# Patient Record
Sex: Male | Born: 1935 | Race: White | Hispanic: No | Marital: Married | Smoking: Former smoker
Health system: Southern US, Community
[De-identification: ages and names within clinical notes are randomized; demographics above are authoritative.]

## PROBLEM LIST (undated history)

## (undated) DIAGNOSIS — I10 Essential (primary) hypertension: Secondary | ICD-10-CM

## (undated) DIAGNOSIS — K219 Gastro-esophageal reflux disease without esophagitis: Secondary | ICD-10-CM

## (undated) DIAGNOSIS — J449 Chronic obstructive pulmonary disease, unspecified: Secondary | ICD-10-CM

## (undated) DIAGNOSIS — N400 Enlarged prostate without lower urinary tract symptoms: Secondary | ICD-10-CM

## (undated) DIAGNOSIS — N289 Disorder of kidney and ureter, unspecified: Secondary | ICD-10-CM

## (undated) DIAGNOSIS — G629 Polyneuropathy, unspecified: Secondary | ICD-10-CM

## (undated) DIAGNOSIS — D649 Anemia, unspecified: Secondary | ICD-10-CM

## (undated) HISTORY — PX: LUMBAR LAMINECTOMY: SHX95

## (undated) HISTORY — PX: HEMORRHOID SURGERY: SHX153

## (undated) HISTORY — PX: BACK SURGERY: SHX140

## (undated) HISTORY — PX: NECK SURGERY: SHX720

## (undated) HISTORY — PX: TONSILLECTOMY: SUR1361

## (undated) HISTORY — PX: APPENDECTOMY: SHX54

---

## 2003-01-07 ENCOUNTER — Emergency Department (HOSPITAL_COMMUNITY): Admission: EM | Admit: 2003-01-07 | Discharge: 2003-01-07 | Payer: Self-pay | Admitting: Emergency Medicine

## 2003-01-07 ENCOUNTER — Encounter: Payer: Self-pay | Admitting: Emergency Medicine

## 2003-01-10 ENCOUNTER — Inpatient Hospital Stay (HOSPITAL_COMMUNITY): Admission: EM | Admit: 2003-01-10 | Discharge: 2003-01-14 | Payer: Self-pay | Admitting: Emergency Medicine

## 2003-01-11 ENCOUNTER — Encounter: Payer: Self-pay | Admitting: Internal Medicine

## 2003-01-12 ENCOUNTER — Encounter: Payer: Self-pay | Admitting: Emergency Medicine

## 2003-03-03 ENCOUNTER — Encounter: Payer: Self-pay | Admitting: Family Medicine

## 2003-03-03 ENCOUNTER — Ambulatory Visit (HOSPITAL_COMMUNITY): Admission: RE | Admit: 2003-03-03 | Discharge: 2003-03-03 | Payer: Self-pay | Admitting: Family Medicine

## 2003-03-29 ENCOUNTER — Encounter: Payer: Self-pay | Admitting: Critical Care Medicine

## 2003-03-29 ENCOUNTER — Encounter (INDEPENDENT_AMBULATORY_CARE_PROVIDER_SITE_OTHER): Payer: Self-pay | Admitting: Specialist

## 2003-03-29 ENCOUNTER — Ambulatory Visit (HOSPITAL_COMMUNITY): Admission: RE | Admit: 2003-03-29 | Discharge: 2003-03-29 | Payer: Self-pay | Admitting: Critical Care Medicine

## 2005-03-11 ENCOUNTER — Encounter: Admission: RE | Admit: 2005-03-11 | Discharge: 2005-03-11 | Payer: Self-pay | Admitting: Nephrology

## 2005-03-25 ENCOUNTER — Encounter: Admission: RE | Admit: 2005-03-25 | Discharge: 2005-03-25 | Payer: Self-pay | Admitting: Nephrology

## 2005-04-14 ENCOUNTER — Encounter: Admission: RE | Admit: 2005-04-14 | Discharge: 2005-04-14 | Payer: Self-pay | Admitting: Family Medicine

## 2005-10-16 ENCOUNTER — Encounter: Admission: RE | Admit: 2005-10-16 | Discharge: 2005-10-16 | Payer: Self-pay | Admitting: Family Medicine

## 2006-07-31 ENCOUNTER — Ambulatory Visit (HOSPITAL_COMMUNITY): Admission: RE | Admit: 2006-07-31 | Discharge: 2006-07-31 | Payer: Self-pay | Admitting: Orthopedic Surgery

## 2006-08-01 ENCOUNTER — Inpatient Hospital Stay (HOSPITAL_COMMUNITY): Admission: EM | Admit: 2006-08-01 | Discharge: 2006-08-05 | Payer: Self-pay | Admitting: Emergency Medicine

## 2006-09-16 HISTORY — PX: JOINT REPLACEMENT: SHX530

## 2006-09-23 ENCOUNTER — Encounter: Admission: RE | Admit: 2006-09-23 | Discharge: 2006-09-23 | Payer: Self-pay | Admitting: Orthopedic Surgery

## 2006-12-01 ENCOUNTER — Inpatient Hospital Stay (HOSPITAL_COMMUNITY): Admission: RE | Admit: 2006-12-01 | Discharge: 2006-12-09 | Payer: Self-pay | Admitting: Orthopedic Surgery

## 2006-12-03 ENCOUNTER — Ambulatory Visit: Payer: Self-pay | Admitting: Physical Medicine & Rehabilitation

## 2006-12-05 ENCOUNTER — Encounter: Payer: Self-pay | Admitting: Vascular Surgery

## 2006-12-05 ENCOUNTER — Ambulatory Visit: Payer: Self-pay | Admitting: Vascular Surgery

## 2008-02-11 ENCOUNTER — Ambulatory Visit: Payer: Self-pay | Admitting: Critical Care Medicine

## 2008-02-11 DIAGNOSIS — J449 Chronic obstructive pulmonary disease, unspecified: Secondary | ICD-10-CM

## 2008-02-11 DIAGNOSIS — I10 Essential (primary) hypertension: Secondary | ICD-10-CM | POA: Insufficient documentation

## 2008-02-11 DIAGNOSIS — J438 Other emphysema: Secondary | ICD-10-CM

## 2008-02-11 DIAGNOSIS — J4489 Other specified chronic obstructive pulmonary disease: Secondary | ICD-10-CM | POA: Insufficient documentation

## 2008-02-11 DIAGNOSIS — E785 Hyperlipidemia, unspecified: Secondary | ICD-10-CM

## 2008-03-10 ENCOUNTER — Ambulatory Visit: Payer: Self-pay | Admitting: Emergency Medicine

## 2008-03-11 ENCOUNTER — Encounter: Payer: Self-pay | Admitting: Critical Care Medicine

## 2008-05-25 ENCOUNTER — Ambulatory Visit: Payer: Self-pay | Admitting: Emergency Medicine

## 2008-07-27 ENCOUNTER — Ambulatory Visit: Payer: Self-pay | Admitting: Emergency Medicine

## 2008-08-26 ENCOUNTER — Ambulatory Visit: Payer: Self-pay | Admitting: Emergency Medicine

## 2008-10-13 ENCOUNTER — Ambulatory Visit: Payer: Self-pay | Admitting: Emergency Medicine

## 2009-08-15 ENCOUNTER — Encounter: Admission: RE | Admit: 2009-08-15 | Discharge: 2009-08-15 | Payer: Self-pay | Admitting: Family Medicine

## 2009-10-21 ENCOUNTER — Inpatient Hospital Stay (HOSPITAL_COMMUNITY): Admission: EM | Admit: 2009-10-21 | Discharge: 2009-10-24 | Payer: Self-pay | Admitting: Emergency Medicine

## 2009-12-16 ENCOUNTER — Inpatient Hospital Stay (HOSPITAL_COMMUNITY): Admission: EM | Admit: 2009-12-16 | Discharge: 2009-12-18 | Payer: Self-pay | Admitting: Emergency Medicine

## 2009-12-19 ENCOUNTER — Encounter: Admission: RE | Admit: 2009-12-19 | Discharge: 2010-02-21 | Payer: Self-pay | Admitting: Family Medicine

## 2010-02-09 ENCOUNTER — Observation Stay (HOSPITAL_COMMUNITY): Admission: EM | Admit: 2010-02-09 | Discharge: 2010-02-11 | Payer: Self-pay | Admitting: Emergency Medicine

## 2010-04-30 ENCOUNTER — Encounter: Admission: RE | Admit: 2010-04-30 | Discharge: 2010-04-30 | Payer: Self-pay | Admitting: Family Medicine

## 2010-05-09 ENCOUNTER — Encounter
Admission: RE | Admit: 2010-05-09 | Discharge: 2010-05-09 | Payer: Self-pay | Admitting: Physical Medicine and Rehabilitation

## 2010-05-18 ENCOUNTER — Emergency Department (HOSPITAL_COMMUNITY): Admission: EM | Admit: 2010-05-18 | Discharge: 2010-05-19 | Payer: Self-pay | Admitting: Emergency Medicine

## 2010-10-07 ENCOUNTER — Encounter: Payer: Self-pay | Admitting: Nephrology

## 2010-10-24 ENCOUNTER — Emergency Department (HOSPITAL_COMMUNITY): Payer: MEDICARE

## 2010-10-24 ENCOUNTER — Inpatient Hospital Stay (HOSPITAL_COMMUNITY)
Admission: EM | Admit: 2010-10-24 | Discharge: 2010-10-26 | DRG: 192 | Disposition: A | Discharge status: Home / Self Care | Payer: MEDICARE | Attending: Internal Medicine | Admitting: Internal Medicine

## 2010-10-24 DIAGNOSIS — I129 Hypertensive chronic kidney disease with stage 1 through stage 4 chronic kidney disease, or unspecified chronic kidney disease: Secondary | ICD-10-CM | POA: Diagnosis present

## 2010-10-24 DIAGNOSIS — J449 Chronic obstructive pulmonary disease, unspecified: Principal | ICD-10-CM | POA: Diagnosis present

## 2010-10-24 DIAGNOSIS — F411 Generalized anxiety disorder: Secondary | ICD-10-CM | POA: Diagnosis present

## 2010-10-24 DIAGNOSIS — R4182 Altered mental status, unspecified: Secondary | ICD-10-CM | POA: Diagnosis present

## 2010-10-24 DIAGNOSIS — N183 Chronic kidney disease, stage 3 unspecified: Secondary | ICD-10-CM | POA: Diagnosis present

## 2010-10-24 DIAGNOSIS — Z8673 Personal history of transient ischemic attack (TIA), and cerebral infarction without residual deficits: Secondary | ICD-10-CM

## 2010-10-24 DIAGNOSIS — J4489 Other specified chronic obstructive pulmonary disease: Principal | ICD-10-CM | POA: Diagnosis present

## 2010-10-24 DIAGNOSIS — E785 Hyperlipidemia, unspecified: Secondary | ICD-10-CM | POA: Diagnosis present

## 2010-10-24 DIAGNOSIS — R269 Unspecified abnormalities of gait and mobility: Secondary | ICD-10-CM | POA: Diagnosis present

## 2010-10-24 LAB — CBC
HCT: 40.3 % (ref 39.0–52.0)
Hemoglobin: 13.8 g/dL (ref 13.0–17.0)
MCH: 31.3 pg (ref 26.0–34.0)
MCHC: 34.2 g/dL (ref 30.0–36.0)
MCV: 91.4 fL (ref 78.0–100.0)

## 2010-10-24 LAB — DIFFERENTIAL
Basophils Absolute: 0 10*3/uL (ref 0.0–0.1)
Lymphocytes Relative: 4 % — ABNORMAL LOW (ref 12–46)
Lymphs Abs: 0.5 10*3/uL — ABNORMAL LOW (ref 0.7–4.0)
Monocytes Absolute: 1.4 10*3/uL — ABNORMAL HIGH (ref 0.1–1.0)
Monocytes Relative: 10 % (ref 3–12)
Neutro Abs: 11.4 10*3/uL — ABNORMAL HIGH (ref 1.7–7.7)

## 2010-10-24 LAB — COMPREHENSIVE METABOLIC PANEL
ALT: 13 U/L (ref 0–53)
CO2: 25 mEq/L (ref 19–32)
Calcium: 8.8 mg/dL (ref 8.4–10.5)
GFR calc non Af Amer: 25 mL/min — ABNORMAL LOW (ref >60)
Glucose, Bld: 90 mg/dL (ref 70–99)
Sodium: 137 mEq/L (ref 135–145)

## 2010-10-25 ENCOUNTER — Inpatient Hospital Stay (HOSPITAL_COMMUNITY): Payer: MEDICARE

## 2010-10-25 ENCOUNTER — Emergency Department (HOSPITAL_COMMUNITY): Payer: MEDICARE

## 2010-10-25 DIAGNOSIS — G459 Transient cerebral ischemic attack, unspecified: Secondary | ICD-10-CM

## 2010-10-25 LAB — PROTIME-INR
INR: 1.03 (ref 0.00–1.49)
Prothrombin Time: 13.7 seconds (ref 11.6–15.2)

## 2010-10-25 LAB — URINALYSIS, ROUTINE W REFLEX MICROSCOPIC
Hgb urine dipstick: NEGATIVE
Ketones, ur: NEGATIVE mg/dL
Leukocytes, UA: NEGATIVE
Protein, ur: 100 mg/dL — AB
Urine Glucose, Fasting: NEGATIVE mg/dL

## 2010-10-25 LAB — COMPREHENSIVE METABOLIC PANEL
Alkaline Phosphatase: 52 U/L (ref 39–117)
BUN: 35 mg/dL — ABNORMAL HIGH (ref 6–23)
Calcium: 8.3 mg/dL — ABNORMAL LOW (ref 8.4–10.5)
GFR calc non Af Amer: 26 mL/min — ABNORMAL LOW (ref >60)
Glucose, Bld: 162 mg/dL — ABNORMAL HIGH (ref 70–99)
Total Protein: 6.2 g/dL (ref 6.0–8.3)

## 2010-10-25 LAB — CARDIAC PANEL(CRET KIN+CKTOT+MB+TROPI)
CK, MB: 3.9 ng/mL (ref 0.3–4.0)
CK, MB: 3.5 ng/mL (ref 0.3–4.0)
CK, MB: 3.9 ng/mL (ref 0.3–4.0)
Relative Index: 1.9 (ref 0.0–2.5)
Relative Index: 2 (ref 0.0–2.5)
Total CK: 183 U/L (ref 7–232)
Total CK: 186 U/L (ref 7–232)
Troponin I: 0.05 ng/mL (ref 0.00–0.06)
Troponin I: 0.04 ng/mL (ref 0.00–0.06)

## 2010-10-25 LAB — NA AND K (SODIUM & POTASSIUM), RAND UR
Potassium Urine: 36 mEq/L
Sodium, Ur: 44 mEq/L

## 2010-10-25 LAB — CBC
Hemoglobin: 13.1 g/dL (ref 13.0–17.0)
RBC: 4.19 MIL/uL — ABNORMAL LOW (ref 4.22–5.81)
WBC: 9.8 10*3/uL (ref 4.0–10.5)

## 2010-10-25 LAB — APTT: aPTT: 34 seconds (ref 24–37)

## 2010-10-25 LAB — LIPID PANEL
Cholesterol: 188 mg/dL (ref 0–200)
Total CHOL/HDL Ratio: 3.4 RATIO

## 2010-10-25 LAB — TROPONIN I: Troponin I: 0.06 ng/mL (ref 0.00–0.06)

## 2010-10-25 LAB — PROTEIN, URINE, RANDOM: Total Protein, Urine: 49 mg/dL

## 2010-10-25 LAB — CK TOTAL AND CKMB (NOT AT ARMC): Total CK: 175 U/L (ref 7–232)

## 2010-10-26 LAB — BASIC METABOLIC PANEL
CO2: 22 mEq/L (ref 19–32)
Calcium: 8.4 mg/dL (ref 8.4–10.5)
Chloride: 109 mEq/L (ref 96–112)
Creatinine, Ser: 2.1 mg/dL — ABNORMAL HIGH (ref 0.4–1.5)
GFR calc Af Amer: 37 mL/min — ABNORMAL LOW (ref >60)
Glucose, Bld: 111 mg/dL — ABNORMAL HIGH (ref 70–99)

## 2010-10-28 NOTE — H&P (Signed)
NAMEALDWIN, MICALIZZI               ACCOUNT NO.:  [id number]  MEDICAL RECORD NO.:  [id number]           PATIENT TYPE:  E  LOCATION:  WLED                         FACILITY:  [facility]  PHYSICIAN:  Conley Canal, MD      DATE OF BIRTH:  [birth year]  DATE OF ADMISSION:  10/24/2010 DATE OF DISCHARGE:                             HISTORY & PHYSICAL   PRIMARY CARE PHYSICIAN:  Dr. Lanora Manis, but he is currently looking for a new PCP.  NEPHROLOGIST:  Dr. Darrick Penna.  CHIEF COMPLAINT:  Cough and confusion.  HISTORY OF PRESENT ILLNESS:  This is a 77 year old male, very pleasant, who has a history of hypertension, chronic kidney disease, baseline creatinine around 1.7, recurrent for COPD, anxiety disorder, hyperlipidemia, came in with complaints of cough, ongoing for 2 days or so as well as reported history of some acute confusion and losing balance.  The patient says that he does not recall what happened, but at the time of evaluation he is quite lucid and answering questions appropriately.  He denies any fever.  The cough is productive of yellowish colored sputum.  Nobody else sick at home.  He denies any headache.  He noted that his balance was off today.  Denies any fall. No nausea, vomiting, or diarrhea.  No genitourinary symptoms.  PAST MEDICAL HISTORY: 1. Chronic kidney disease, stage 3. 2. Hypertension. 3. Hyperlipidemia. 4. Anxiety disorder. 5. COPD. 6. Recurrent falls.  ALLERGIES:  No known drug allergies.  HOME MEDICATIONS:  Lisinopril, belladonna, Neurontin, Synthroid, trazodone, Cymbalta, ranitidine, Proventil, Combivent, Symbicort, aspirin.  SOCIAL HISTORY:  Patient is married, lives with his wife.  Stopped smoking cigarettes many years ago.  Denies illicit drugs.  Drinks alcohol occasionally.  REVIEW OF SYSTEMS:  Unremarkable except as highlighted in the history of present illness.  PHYSICAL EXAMINATION:  GENERAL:  This is a well looking male, not in acute  distress. VITAL SIGNS:  Blood pressure 122/56, heart rate is 93, temperature 98.2, respirations 15, oxygen saturation is 95% on 2 L nasal cannula. HEAD, EARS, EYES, NOSE, AND THROAT:  Pupils equal, reacting to light. No jugular venous distention.  No carotid bruits. RESPIRATORY:  Good air entry bilaterally.  Some basilar rhonchi.  No wheezing. CARDIOVASCULAR:  S1 and S2 heart sounds are heard.  No murmurs.  Pulse regular. ABDOMEN:  Soft, nontender.  No palpable organomegaly.  Bowel sounds are normal. CNS:  The patient is alert and oriented in person, place, and time.  He does not have any focal neurological deficits. EXTREMITIES:  There is no pedal edema.  Peripheral pulses are equal.  LABORATORY DATA:  Labs were reviewed, significant for WBC 13.6, hemoglobin 13.8, hematocrit 40.3, platelet count 275, neutrophils 84%. Sodium 137, potassium 4.6, BUN 36, creatinine 2.49.  LFTs normal. Urinalysis negative.  Chest x-ray shows emphysema, chronic opacity in the lingula probably from atelectasis or scarring and chronic left apical scarring.  EKG shows normal sinus rhythm, right bundle-branch block.  IMPRESSION:  A 77 year old male who is presenting with acute delirium and loss of balance.  He has suggestion of early pneumonia versus chronic obstructive pulmonary disease exacerbation as well as  possibility of small cerebrovascular accident versus transient ischemic attack.  He also has some worsening renal insufficiency, which could be multifactorial in etiology including dehydration and medications, ongoing infection.  PLAN: 1. Transient ischemic attack versus cerebrovascular accident.  Patient     will be admitted to Telemetry.  We will obtain serial cardiac     enzymes, stroke protocol including MRI, 2-D echocardiogram, carotid     duplex.  Meanwhile, he will be on aspirin and statin.  We will     consult Physical Therapy. 2. Community-acquired pneumonia versus chronic obstructive  pulmonary     disease.  We will place on ceftriaxone and azithromycin to complete     7 days of antibiotics. 3. Acute-on-chronic kidney disease.  We will gently rehydrate the     patient, hold lisinopril for now. 4. Hypertension.  Seems controlled.  Continue home medications. 5. Peripheral neuropathy, hypothyroidism, seems stable.  Continue home     medications. 6. Deep vein thrombosis prophylaxis, Lovenox. 7. Gastrointestinal prophylaxis, PPI. 8. Depression.  Seems stable.  Continue Cymbalta and trazodone. 9. Patient's condition is guarded.     Conley Canal, MD     SR/MEDQ  D:  10/25/2010  T:  10/25/2010  Job:  [id number]  cc:   Dr. Darrick Penna  Electronically Signed by Conley Canal  on 10/25/2010 08:31:11 PM

## 2010-11-10 NOTE — Discharge Summary (Signed)
Walter [name], Walter [name]               ACCOUNT NO.:  [id number]  MEDICAL RECORD NO.:  [id number]           PATIENT TYPE:  I  LOCATION:  1419                         FACILITY:  [facility]  PHYSICIAN:  Walter Scott, MD     DATE OF BIRTH:  20-Aug-1930  DATE OF ADMISSION:  10/24/2010 DATE OF DISCHARGE:  10/26/2010                              DISCHARGE SUMMARY   PRIMARY CARE PHYSICIAN:  Walter [name], M.D.  NEPHROLOGIST:  Walter [name], M.D.  DISCHARGE DIAGNOSES: 1. Altered mental status, resolved. 2. Mild acute-on-chronic bronchitis, improved. 3. Chronic kidney disease, creatinine possibly at baseline. 4. Hypertension. 5. Hyperlipidemia. 6. History of anxiety disorder. 7. History of recurrent falls and continues to have unsteady gait, but     declines a skilled nursing facility placement. 8. H/o CVA's  9. Hypothyroid 10. Moderaete LVH on echo.  DISCHARGE MEDICATIONS: 1. Azithromycin 500 mg p.o. on October 27, 2010, and then     discontinue. 2. Enteric-coated aspirin 325 mg p.o. daily. 3. Simvastatin 20 mg p.o. q.h.s. 4. Alprazolam 0.5 mg p.o. q.h.s. p.r.n. for insomnia. 5. Albuterol inhaler 90 mcg, two puffs inhaled q.4 hours p.r.n. 6. Asmanex inhaler 220 mcg, two puffs inhaled daily. 7. Cymbalta 30 mg p.o. daily. 8. Gabapentin 100 mg p.o. b.i.d. 9. Levothyroxine 25 mcg p.o. daily. 10.Lisinopril 40 mg p.o. daily. 11.PreserVision one tablet p.o. b.i.d. 12.Ranitidine 300 mg p.o. q.h.s. 13.Symbicort 160/4.5 mcg, one puff inhaled b.i.d. 14.Spiriva 18 mcg inhaled daily. 15.Trazodone 50 mg p.o. q.h.s. 16.Vitamin D3 1000 units, two tablets p.o. daily.  IMAGING STUDIES AND PROCEDURES: 1. MRI of the head without contrast.  Impression:  Motion-degraded     exam.  No acute infarct.  Remote infarcts and prominent small     vessel disease-type changes.  Global atrophy with stable mild     ventricular prominence. 2. MRA of the head.  Impression:  Intracranial atherosclerotic-type   changes detected on this motion-degraded exam. 3. Chest x-ray on February 8.  Impression:     a.     Emphysema.     b.     Chronic opacity in the lingula, probably from atelectasis or      scarring.     c.     Chronic left apical scarring. 4. CT of the head without contrast.  Impression:     a.     Stable remote lacunar infarcts without acute intracranial      findings.     b.     Chronic maxillary sinusitis with small right mastoid      effusion. 5. A 2-D echocardiogram shows moderate left ventricular hypertrophy.     Systolic function was normal.  Estimated ejection fraction was 55%     to 60%.  LABORATORY DATA: 1. Basic metabolic panel today significant for BUN of 41, creatinine     2.10. 2. Cardiac enzymes were cycled and negative.  Hemoglobin A1c was 6.2.     Lipid panel with LDL of 124.  Hepatic panel only significant for     albumin of 3.2.  INR is 1.03.  CBC is within normal limit. 3. Urinalysis negative  for features of urinary tract infection.  CONSULTATIONS:  None.  DIET:  Heart-healthy diet.  ACTIVITY:  Increase activity slowly and with assistance.  The patient will have 24/7 supervision from his spouse.  He is to use his home durable medical equipment including his rolling walker, two-in-one, cane.  COMPLAINTS:  Today none.  The patient indicates that he has no further cough or dyspnea or chest pain.  PHYSICAL EXAMINATION:  GENERAL:  Walter [name] is in no obvious distress. VITAL SIGNS:  Telemetry shows sinus rhythm in 60s to 70s with few PVCs, not convincing for three beat nonsustained ventricular tachycardia. Temperature 98.1 degrees Fahrenheit, pulse 68 per minute, respiration 18 per minute, blood pressure 117/57 mmHg and saturating at 94% on room air. RESPIRATORY SYSTEM:  Clear.  No increased work of breathing. CARDIOVASCULAR SYSTEM:  First and second heart sounds heard.  Regular rate and rhythm. ABDOMEN:  Soft and bowel sounds present. CENTRAL NERVOUS  SYSTEM:  Patient is awake, alert, oriented x4 with no focal neurological deficits.  HOSPITAL COURSE:  Walter [name] is a 77 year old Caucasian male patient with history of hypertension, stage 3 chronic kidney disease, COPD, anxiety disorder and possible depression and hyperlipidemia who presented with cough of 2 days' duration and reported history of acute confusion and losing balance, although by the time the patient was evaluated in the ED he was quite lucid and appropriate.  He was admitted for further evaluation and management.  1. Altered mental status.  Unclear etiology.  However, the patient was     admitted.  His CT scan shows old CVAs.  This was followed with an     MRI which did not show any acute infarcts.  His altered mental status may have been     secondary to mild acute bronchitis.  His mental status, however,     quickly resolved.  He is eager to go home. 2. Mild acute bronchitis complicating COPD.  He was placed on IV     antibiotics and nebulizers.  He will complete a total 3-day course     of Zithromax.  He has no further cough and has been afebrile and     his chest is clear.  He can continue his other home COPDmedications. 3. Mild acute-on-chronic kidney disease versus chronic kidney disease     with a new high baseline creatinine.  The patient will follow up     with his primary nephrologist as an outpatient.  He is to continue     his home medications. 4. Old CVAs on CT scan.  The patient will be on aspirin and statins.     Physical Therapy and Occupational Therapy evaluated him and     indicated that his gait was unsteady and recommended short-term     skilled nursing facility, which the patient has clearly declined     and indicates that he will be returning home and his wife is with     him 24/7 and she will be able to assist him.  DISPOSITION:  The patient is discharged home in stable condition.  FOLLOWUP: 1. The patient has a preset appointment to see his  primary care     physician, Dr. Larita [name] on February 15.  The patient is advised     to keep up that appointment. 2. Dr. Darrick Penna.  The patient is to call for appointment and his     renal panel can be periodically followed up.  Time taken in coordinating this discharge was  45 minutes.     Walter Scott, MD     AH/MEDQ  D:  10/26/2010  T:  10/26/2010  Job:  [id number]  cc:   Walter [name], M.D.  James L. [name], M.D. Fax: [phone]  Electronically Signed by Walter Scott MD on 11/10/2010 06:18:35 PM

## 2010-11-29 LAB — POCT I-STAT, CHEM 8
BUN: 34 mg/dL — ABNORMAL HIGH (ref 6–23)
Calcium, Ion: 1 mmol/L — ABNORMAL LOW (ref 1.12–1.32)
Creatinine, Ser: 1.6 mg/dL — ABNORMAL HIGH (ref 0.4–1.5)
Glucose, Bld: 101 mg/dL — ABNORMAL HIGH (ref 70–99)
TCO2: 19 mmol/L (ref 0–100)

## 2010-11-29 LAB — CBC
MCH: 32.5 pg (ref 26.0–34.0)
MCHC: 34.3 g/dL (ref 30.0–36.0)
Platelets: 361 10*3/uL (ref 150–400)
RBC: 4.47 MIL/uL (ref 4.22–5.81)

## 2010-11-29 LAB — URINALYSIS, ROUTINE W REFLEX MICROSCOPIC
Bilirubin Urine: NEGATIVE
Glucose, UA: NEGATIVE mg/dL
Protein, ur: 100 mg/dL — AB
Urobilinogen, UA: 0.2 mg/dL (ref 0.0–1.0)

## 2010-11-29 LAB — COMPREHENSIVE METABOLIC PANEL
ALT: 16 U/L (ref 0–53)
AST: 17 U/L (ref 0–37)
Albumin: 3.9 g/dL (ref 3.5–5.2)
Calcium: 9.2 mg/dL (ref 8.4–10.5)
Creatinine, Ser: 1.65 mg/dL — ABNORMAL HIGH (ref 0.4–1.5)
GFR calc Af Amer: 49 mL/min — ABNORMAL LOW (ref >60)
Sodium: 140 mEq/L (ref 135–145)

## 2010-11-29 LAB — DIFFERENTIAL
Eosinophils Relative: 1 % (ref 0–5)
Lymphocytes Relative: 10 % — ABNORMAL LOW (ref 12–46)
Lymphs Abs: 1.6 10*3/uL (ref 0.7–4.0)
Monocytes Absolute: 1.4 10*3/uL — ABNORMAL HIGH (ref 0.1–1.0)
Monocytes Relative: 8 % (ref 3–12)

## 2010-11-29 LAB — POCT CARDIAC MARKERS
CKMB, poc: <1 ng/mL — ABNORMAL LOW (ref 1.0–8.0)
Myoglobin, poc: 101 ng/mL (ref 12–200)
Myoglobin, poc: 89.2 ng/mL (ref 12–200)

## 2010-11-29 LAB — BRAIN NATRIURETIC PEPTIDE: Pro B Natriuretic peptide (BNP): <30 pg/mL (ref 0.0–100.0)

## 2010-11-29 LAB — URINE MICROSCOPIC-ADD ON

## 2010-12-03 LAB — BASIC METABOLIC PANEL
CO2: 20 mEq/L (ref 19–32)
CO2: 23 mEq/L (ref 19–32)
Calcium: 8.7 mg/dL (ref 8.4–10.5)
Chloride: 109 mEq/L (ref 96–112)
Chloride: 110 mEq/L (ref 96–112)
Chloride: 113 mEq/L — ABNORMAL HIGH (ref 96–112)
Creatinine, Ser: 1.79 mg/dL — ABNORMAL HIGH (ref 0.4–1.5)
Creatinine, Ser: 1.99 mg/dL — ABNORMAL HIGH (ref 0.4–1.5)
GFR calc Af Amer: 40 mL/min — ABNORMAL LOW (ref >60)
GFR calc Af Amer: 45 mL/min — ABNORMAL LOW (ref >60)
GFR calc non Af Amer: 37 mL/min — ABNORMAL LOW (ref >60)
Glucose, Bld: 102 mg/dL — ABNORMAL HIGH (ref 70–99)
Potassium: 5.4 mEq/L — ABNORMAL HIGH (ref 3.5–5.1)
Potassium: 5.4 mEq/L — ABNORMAL HIGH (ref 3.5–5.1)
Sodium: 139 mEq/L (ref 135–145)
Sodium: 139 mEq/L (ref 135–145)
Sodium: 140 mEq/L (ref 135–145)

## 2010-12-03 LAB — URINALYSIS, ROUTINE W REFLEX MICROSCOPIC
Bilirubin Urine: NEGATIVE
Glucose, UA: NEGATIVE mg/dL
Hgb urine dipstick: NEGATIVE
Ketones, ur: NEGATIVE mg/dL
pH: 5 (ref 5.0–8.0)

## 2010-12-03 LAB — COMPREHENSIVE METABOLIC PANEL
AST: 17 U/L (ref 0–37)
Albumin: 3.4 g/dL — ABNORMAL LOW (ref 3.5–5.2)
Calcium: 8.7 mg/dL (ref 8.4–10.5)
Chloride: 109 mEq/L (ref 96–112)
Creatinine, Ser: 1.78 mg/dL — ABNORMAL HIGH (ref 0.4–1.5)
GFR calc Af Amer: 45 mL/min — ABNORMAL LOW (ref >60)
Total Bilirubin: 0.6 mg/dL (ref 0.3–1.2)

## 2010-12-03 LAB — DIFFERENTIAL
Eosinophils Relative: 1 % (ref 0–5)
Lymphocytes Relative: 9 % — ABNORMAL LOW (ref 12–46)
Lymphs Abs: 1.1 10*3/uL (ref 0.7–4.0)
Monocytes Absolute: 1.2 10*3/uL — ABNORMAL HIGH (ref 0.1–1.0)

## 2010-12-03 LAB — CBC
MCV: 96.7 fL (ref 78.0–100.0)
MCV: 96.7 fL (ref 78.0–100.0)
Platelets: 322 10*3/uL (ref 150–400)
RBC: 4.22 MIL/uL (ref 4.22–5.81)
WBC: 10.7 10*3/uL — ABNORMAL HIGH (ref 4.0–10.5)
WBC: 12.9 10*3/uL — ABNORMAL HIGH (ref 4.0–10.5)

## 2010-12-03 LAB — CARDIAC PANEL(CRET KIN+CKTOT+MB+TROPI)
CK, MB: 2.2 ng/mL (ref 0.3–4.0)
Relative Index: 4 — ABNORMAL HIGH (ref 0.0–2.5)
Relative Index: INVALID (ref 0.0–2.5)
Total CK: 48 U/L (ref 7–232)
Troponin I: 0.03 ng/mL (ref 0.00–0.06)

## 2010-12-03 LAB — LACTIC ACID, PLASMA: Lactic Acid, Venous: 1.1 mmol/L (ref 0.5–2.2)

## 2010-12-03 LAB — URINE MICROSCOPIC-ADD ON

## 2010-12-05 LAB — DIFFERENTIAL
Basophils Absolute: 0 10*3/uL (ref 0.0–0.1)
Lymphocytes Relative: 7 % — ABNORMAL LOW (ref 12–46)
Monocytes Absolute: 1.2 10*3/uL — ABNORMAL HIGH (ref 0.1–1.0)
Neutro Abs: 11.8 10*3/uL — ABNORMAL HIGH (ref 1.7–7.7)
Neutrophils Relative %: 84 % — ABNORMAL HIGH (ref 43–77)

## 2010-12-05 LAB — URINE MICROSCOPIC-ADD ON

## 2010-12-05 LAB — POCT CARDIAC MARKERS
CKMB, poc: 2.9 ng/mL (ref 1.0–8.0)
Myoglobin, poc: 165 ng/mL (ref 12–200)
Myoglobin, poc: 201 ng/mL (ref 12–200)
Troponin i, poc: <0.05 ng/mL (ref 0.00–0.09)

## 2010-12-05 LAB — BASIC METABOLIC PANEL
BUN: 25 mg/dL — ABNORMAL HIGH (ref 6–23)
BUN: 37 mg/dL — ABNORMAL HIGH (ref 6–23)
CO2: 21 mEq/L (ref 19–32)
Calcium: 8.3 mg/dL — ABNORMAL LOW (ref 8.4–10.5)
Calcium: 8.4 mg/dL (ref 8.4–10.5)
Creatinine, Ser: 1.67 mg/dL — ABNORMAL HIGH (ref 0.4–1.5)
Creatinine, Ser: 1.81 mg/dL — ABNORMAL HIGH (ref 0.4–1.5)
GFR calc Af Amer: 44 mL/min — ABNORMAL LOW (ref >60)
GFR calc Af Amer: 49 mL/min — ABNORMAL LOW (ref >60)
Glucose, Bld: 94 mg/dL (ref 70–99)

## 2010-12-05 LAB — CBC
HCT: 40.4 % (ref 39.0–52.0)
MCHC: 33.1 g/dL (ref 30.0–36.0)
MCHC: 33.3 g/dL (ref 30.0–36.0)
MCV: 94.8 fL (ref 78.0–100.0)
MCV: 95.8 fL (ref 78.0–100.0)
Platelets: 228 10*3/uL (ref 150–400)
Platelets: 237 10*3/uL (ref 150–400)
Platelets: 260 10*3/uL (ref 150–400)
RBC: 3.76 MIL/uL — ABNORMAL LOW (ref 4.22–5.81)
RDW: 14.2 % (ref 11.5–15.5)
RDW: 14.7 % (ref 11.5–15.5)
WBC: 8.8 10*3/uL (ref 4.0–10.5)

## 2010-12-05 LAB — COMPREHENSIVE METABOLIC PANEL
Albumin: 3.9 g/dL (ref 3.5–5.2)
BUN: 54 mg/dL — ABNORMAL HIGH (ref 6–23)
Chloride: 107 mEq/L (ref 96–112)
Creatinine, Ser: 2.26 mg/dL — ABNORMAL HIGH (ref 0.4–1.5)
Total Bilirubin: 0.6 mg/dL (ref 0.3–1.2)
Total Protein: 6.9 g/dL (ref 6.0–8.3)

## 2010-12-05 LAB — URINE CULTURE

## 2010-12-05 LAB — CULTURE, BLOOD (ROUTINE X 2)
Culture: NO GROWTH
Culture: NO GROWTH

## 2010-12-05 LAB — VITAMIN B12: Vitamin B-12: 373 pg/mL (ref 211–911)

## 2010-12-05 LAB — URINALYSIS, ROUTINE W REFLEX MICROSCOPIC
Glucose, UA: NEGATIVE mg/dL
Hgb urine dipstick: NEGATIVE
Specific Gravity, Urine: 1.011 (ref 1.005–1.030)

## 2010-12-06 LAB — DIFFERENTIAL
Eosinophils Absolute: 0.1 10*3/uL (ref 0.0–0.7)
Lymphs Abs: 0.9 10*3/uL (ref 0.7–4.0)
Monocytes Relative: 7 % (ref 3–12)
Neutro Abs: 11 10*3/uL — ABNORMAL HIGH (ref 1.7–7.7)
Neutrophils Relative %: 84 % — ABNORMAL HIGH (ref 43–77)

## 2010-12-06 LAB — URINALYSIS, ROUTINE W REFLEX MICROSCOPIC
Glucose, UA: NEGATIVE mg/dL
Ketones, ur: NEGATIVE mg/dL
Leukocytes, UA: NEGATIVE
Nitrite: NEGATIVE
Nitrite: NEGATIVE
Protein, ur: 100 mg/dL — AB
Specific Gravity, Urine: 1.019 (ref 1.005–1.030)
Urobilinogen, UA: 1 mg/dL (ref 0.0–1.0)
pH: 5 (ref 5.0–8.0)

## 2010-12-06 LAB — BASIC METABOLIC PANEL
BUN: 36 mg/dL — ABNORMAL HIGH (ref 6–23)
BUN: 46 mg/dL — ABNORMAL HIGH (ref 6–23)
BUN: 48 mg/dL — ABNORMAL HIGH (ref 6–23)
CO2: 19 mEq/L (ref 19–32)
CO2: 23 mEq/L (ref 19–32)
Calcium: 7.8 mg/dL — ABNORMAL LOW (ref 8.4–10.5)
Calcium: 8.6 mg/dL (ref 8.4–10.5)
Chloride: 102 mEq/L (ref 96–112)
Chloride: 110 mEq/L (ref 96–112)
Creatinine, Ser: 1.82 mg/dL — ABNORMAL HIGH (ref 0.4–1.5)
Creatinine, Ser: 2.69 mg/dL — ABNORMAL HIGH (ref 0.4–1.5)
GFR calc Af Amer: 28 mL/min — ABNORMAL LOW (ref >60)
GFR calc Af Amer: 29 mL/min — ABNORMAL LOW (ref >60)
GFR calc Af Amer: 37 mL/min — ABNORMAL LOW (ref >60)
GFR calc Af Amer: 44 mL/min — ABNORMAL LOW (ref >60)
GFR calc non Af Amer: 23 mL/min — ABNORMAL LOW (ref >60)
GFR calc non Af Amer: 31 mL/min — ABNORMAL LOW (ref >60)
Glucose, Bld: 99 mg/dL (ref 70–99)
Potassium: 5.5 mEq/L — ABNORMAL HIGH (ref 3.5–5.1)
Potassium: 6 mEq/L — ABNORMAL HIGH (ref 3.5–5.1)
Sodium: 139 mEq/L (ref 135–145)
Sodium: 140 mEq/L (ref 135–145)

## 2010-12-06 LAB — HEPATIC FUNCTION PANEL
ALT: 21 U/L (ref 0–53)
AST: 35 U/L (ref 0–37)
Bilirubin, Direct: 0.1 mg/dL (ref 0.0–0.3)
Indirect Bilirubin: 0.2 mg/dL — ABNORMAL LOW (ref 0.3–0.9)
Total Bilirubin: 0.3 mg/dL (ref 0.3–1.2)

## 2010-12-06 LAB — PROTIME-INR
INR: 1.09 (ref 0.00–1.49)
Prothrombin Time: 14 seconds (ref 11.6–15.2)

## 2010-12-06 LAB — CBC
HCT: 35.8 % — ABNORMAL LOW (ref 39.0–52.0)
MCHC: 34.2 g/dL (ref 30.0–36.0)
Platelets: 327 10*3/uL (ref 150–400)
Platelets: 353 10*3/uL (ref 150–400)
Platelets: 388 10*3/uL (ref 150–400)
RBC: 4.39 MIL/uL (ref 4.22–5.81)
RDW: 13.6 % (ref 11.5–15.5)
WBC: 13.1 10*3/uL — ABNORMAL HIGH (ref 4.0–10.5)
WBC: 15.9 10*3/uL — ABNORMAL HIGH (ref 4.0–10.5)

## 2010-12-06 LAB — AMMONIA: Ammonia: 17 umol/L (ref 11–35)

## 2010-12-06 LAB — LACTIC ACID, PLASMA: Lactic Acid, Venous: 2 mmol/L (ref 0.5–2.2)

## 2010-12-06 LAB — CK TOTAL AND CKMB (NOT AT ARMC)
CK, MB: 13.4 ng/mL (ref 0.3–4.0)
Relative Index: 1.3 (ref 0.0–2.5)
Total CK: 1021 U/L — ABNORMAL HIGH (ref 7–232)

## 2010-12-06 LAB — CARDIAC PANEL(CRET KIN+CKTOT+MB+TROPI)
CK, MB: 11.9 ng/mL (ref 0.3–4.0)
CK, MB: 9.8 ng/mL (ref 0.3–4.0)
Relative Index: 1.4 (ref 0.0–2.5)
Total CK: 743 U/L — ABNORMAL HIGH (ref 7–232)
Troponin I: 0.03 ng/mL (ref 0.00–0.06)

## 2010-12-06 LAB — POCT CARDIAC MARKERS: Troponin i, poc: <0.05 ng/mL (ref 0.00–0.09)

## 2010-12-06 LAB — TSH: TSH: 0.694 u[IU]/mL (ref 0.350–4.500)

## 2010-12-06 LAB — CK: Total CK: 357 U/L — ABNORMAL HIGH (ref 7–232)

## 2010-12-06 LAB — URINE MICROSCOPIC-ADD ON

## 2010-12-06 LAB — BRAIN NATRIURETIC PEPTIDE: Pro B Natriuretic peptide (BNP): 284 pg/mL — ABNORMAL HIGH (ref 0.0–100.0)

## 2010-12-06 LAB — RAPID URINE DRUG SCREEN, HOSP PERFORMED
Amphetamines: NOT DETECTED
Barbiturates: NOT DETECTED
Benzodiazepines: POSITIVE — AB
Cocaine: NOT DETECTED

## 2010-12-06 LAB — URINE CULTURE
Colony Count: NO GROWTH
Culture: NO GROWTH

## 2010-12-06 LAB — PHOSPHORUS: Phosphorus: 4.6 mg/dL (ref 2.3–4.6)

## 2010-12-06 LAB — ETHANOL: Alcohol, Ethyl (B): 5 mg/dL (ref 0–10)

## 2010-12-06 LAB — GLUCOSE, CAPILLARY

## 2010-12-06 LAB — APTT: aPTT: 36 seconds (ref 24–37)

## 2011-02-01 NOTE — H&P (Signed)
Walter [name], Walter [name]               ACCOUNT NO.:  [id number]   MEDICAL RECORD NO.:  [id number]          PATIENT TYPE:  INP   LOCATION:  6710                         FACILITY:  [facility]   PHYSICIAN:  Walter [name], M.D.   DATE OF BIRTH:  [birth year]   DATE OF ADMISSION:  08/01/2006  DATE OF DISCHARGE:                                HISTORY & PHYSICAL   PRIMARY CARE PHYSICIAN:  Walter [name], M.D., [facility] practice.   NEPHROLOGIST:  Walter [name].   ORTHOPEDIC DOCTOR:  Walter Frankel. Charlann [name], M.D.   CHIEF COMPLAINTS:  Fusion, weakness, failure to thrive, fever.   HISTORY OF PRESENT ILLNESS:  The patient is a 77 year old male with a 4-day  history of increasing confusion and weakness.  According to the patient's  wife, he fell several days ago and had significant amount of left hip pain  afterward.  He was evaluated by his primary care Clemencia Helzer and put on Vicodin  for pain control and sent to his orthopedic doctor.  He did have evidence of  a stress fracture on July 30, 2006; however, the orthopedic physician  did feel that he has some underlying medical issues that would need to be  addressed before any surgery could be considered.  It was felt that he will  inevitably need a hip replacement.  He returned to his primary care  Kaena Santori's office, and a urinalysis was sent during that visit.  He was  found to have a large amount of hematuria.  Subsequently underwent a CT  urogram which was negative.  Due to progression of symptoms, he was referred  to the emergency department for further evaluation and workup.  His main  complaints currently are weakness and urinary hesitancy.  The patient states  that he has been having to get up frequently to use the bathroom but has  been unable to pass his urine effectively.  He denies any associated  dysuria.  He did had fever several days ago.  His appetite is markedly  diminished.  He denies any nausea, vomiting or diarrhea.  Does have  headache.  Denies cough.  Has had recent nasal congestion and rhinorrhea.  Upon evaluation, it is noted that he is in acute renal failure with a  current creatinine of 3 as compared to creatinine done several days ago  which was 1.5.  Additionally, he has a leukocytosis with a left shift.  We  are admitting him for further evaluation and workup.   PAST MEDICAL HISTORY:  1. Degenerative joint disease.  2. Hypertension.  3. Hyperlipidemia.  4. Hematuria with a negative CT urogram recently.  5. Chronic kidney disease.  6. Benign prostatic hypertrophy.  7. Right renal cyst.  8. Chronic obstructive pulmonary disease with history of bronchoscopy done      by Dr. Delford Field showing no significant abnormalities.  9. History of multiple spinal surgeries.  10.Carpal tunnel syndrome.  11.Raynaud's phenomenon.   FAMILY HISTORY:  The patient's father died at age 83 from stroke.  He also  had hypertension.  Mother died of old age at 57.  He has one  healthy sister.  He was two healthy offspring.   SOCIAL HISTORY:  The patient is married and lives with his wife.  He quit  smoking about 5 years ago; prior to this, was a one-to-two-pack-a-day  smoker.  He drinks one drink a night.  He is a retired Merchandiser, retail.   ALLERGIES:  No known drug allergies.   CURRENT MEDICATIONS:  1. Lisinopril 40 mg daily.  2. Vicodin p.r.n.  3. Hydrochlorothiazide 25 mg daily.  4. Trazodone 50 mg nightly.  5. Zoloft 50 mg daily.  6. Lipitor 10 mg daily.   REVIEW OF SYSTEMS:  As per HPI, otherwise negative.   PHYSICAL EXAMINATION:  VITAL SIGNS: Temperature 97.2, pulse 74, respirations  24, blood pressure 130/67, O2 saturation 95% on room air.  GENERAL:  This is a well-developed, well-nourished male who is appears to be  ill and in mild distress.  HEENT: Normocephalic, atraumatic.  PERRL.  EOMI.  Oropharynx reveals dry  mucous membranes.  NECK:  Supple, no thyromegaly, no lymphadenopathy, no jugular venous   distension.  CHEST:  Lungs clear to auscultation bilaterally with fair air movement.  Decreased at the bases.  HEART:  Regular rate, rhythm.  No murmurs, rubs, gallops.  ABDOMEN:  Soft, nontender, nondistended with normoactive bowel sounds.  EXTREMITIES:  No clubbing, edema, cyanosis.  SKIN:  Warm and dry.  No rashes.  NEUROLOGIC:  The patient is somewhat lethargic but able to answer questions  appropriately.  He is slightly hard of hearing.  He has some generalized  weakness but moves all extremities x4 with equal strength.  Cranial nerves  II-XII grossly intact.   DATA REVIEW.:  A 12-lead EKG reveals normal sinus rhythm with a sinus  arrhythmia.  There is a right bundle branch block and a septal infarct with  Q-waves in the anterior leads.   CT scan of the head shows a no acute intracranial abnormalities.   Chest x-ray shows COPD, chronic bronchitis, left lung scarring, chronic  interstitial lung disease.   Laboratory data:  White blood cell count of 13.2, hemoglobin 13.3,  hematocrit 38.8, platelets 292 with an absolute neutrophil count of 11.  Sodium is 131, potassium 5.0, chloride 95, bicarb 25, BUN 69, creatinine 3,  glucose 91.  Alkaline phosphatase 103, AST 54, ALT 58, total protein 7.3,  albumin 3.0.  ESR is 104.   ASSESSMENT/PLAN:  1. Altered mental status with failure to thrive:  The patient has multiple      issues.  He is in acute renal failure which is likely prerenal and      postobstructive.  Additionally, he is likely infected either in the      lung tissue for possibly has a urinary tract infection.  He has known      pain with recent use of narcotic medications.  All of these things are      likely the underlying explanation for his altered mental status and      failure to thrive.  We will address these issues.  2. Acute renal failure with chronic kidney disease:  The patient's     creatinine has doubled in 48 hours' time.  Given this, he likely has      both  prerenal azotemia and possibly a post obstructive issue with his      enlarged prostate.  Will place Foley catheter.  His CT urogram was      negative for hydronephrosis but could be an rule obstruction that had  not yet shown up on the CT angiogram.  We will check his urine for      signs of infection and culture his urine as well.  Will monitor his      creatinine closely.  Will hold his lisinopril and hydrochlorothiazide      for now and gently hydrated.  3. Hyponatremia:  The patient is mildly hyponatremic.  We will monitor his      electrolytes and start an IV of normal saline to help normalizes sodium      balance.  4. Chronic obstructive pulmonary disease:  The patient likely has a mild      COPD exacerbation.  Although he denies shortness of breath and sputum      production, he clearly is mildly dyspneic by my exam.  Chest x-ray did      not show any evidence of pneumonia, but he does have a recent upper      respiratory type infection and changes of chronic bronchitis.      Therefore, will empirically treat him with Rocephin and azithromycin      and use nebulized bronchodilator therapy p.r.n..  5. Hyperkalemia:  The patient is mildly hyperkalemic.  This is likely      related to his renal failure.  We will watch this closely and give him      Kayexalate if this worsens to any significant degree.  6. Generalized weakness:  The patient's generalized weakness likely stems      from the above-noted issues.  For completeness, I will also check a BNP      and cycle cardiac enzymes q.8 h x3 while monitoring on telemetry unit.      He does have some abnormalities on 12-lead EKG.  7. Hyperlipidemia:  Will continue patient's statin therapy.  8. Prophylaxis:  Initiate GI prophylaxis with Protonix,  DVT prophylaxis      with Lovenox.      Walter [name], M.D.  Electronically Signed     CR/MEDQ  D:  08/01/2006  T:  08/02/2006  Job:  [id number]   cc:   Walter [name], M.D.  Walter [name], M.D.

## 2011-02-01 NOTE — Consult Note (Signed)
Walter [name], DECESARE               ACCOUNT NO.:  [id number]   MEDICAL RECORD NO.:  [id number]          PATIENT TYPE:  INP   LOCATION:  5017                         FACILITY:  [facility]   PHYSICIAN:  Lonia Blood, M.D.      DATE OF BIRTH:  06-17-1930   DATE OF CONSULTATION:  DATE OF DISCHARGE:                                 CONSULTATION   REASON FOR CONSULTATION:  Hypoxia.   CONSULTATION REQUESTED BY:  Dr. Shelda Pal.   HISTORY OF PRESENT ILLNESS:  The patient is a 77 year old gentleman that  was admitted on December 01, 2006, and had a left total hip replacement.  Postoperatively, the patient was on PCA pump.  Last night, he suddenly  developed severe hypoxia, and his saturations dropped into the 80s.  He  was seen, placed on BiPAP, given a touch of Lasix, and the patient seem  to have improved.  He received Percocet again today and seem to have  been very lethargic all day until now.  Following that, the patient has  also been short of breath and hypoxic, and he has been on BiPAP for the  last couple of hours.  He is awake however, alert, oriented, and very  pleasant, and able to give history.  He denied any chest pain, no cough.  Also denied any hemoptysis.   PAST MEDICAL HISTORY:  1. Osteoarthritis.  2. Status post left total hip replacement.  3. Hypertension.  4. COPD.  5. Chronic kidney disease.  6. Degenerative disk disease.  7. Generalized anxiety disorder.  8. Osteoarthrosis.   PAST SURGICAL HISTORY:  1. Surgical fusion in 2000.  2. Cervical laminectomy in 2003.  3. Lumbar laminectomy in 2002.  4. Status post appendectomy in 1951.  5. Tonsillectomy in 1937.  6. Hemorrhoidectomy in 1953.   ALLERGIES:  THE PATIENT IS ALLERGIC TO VICODIN WHICH CAUSES SOME ITCHING  AND ALSO ALTERED LEVEL OF CONSCIOUSNESS FROM TIME TO TIME.   MEDICATIONS:  1. Hydrochlorothiazide 25 mg daily prior to arrival.  That is on hold      right now.  2. Lisinopril is on hold due to chronic  kidney disease.  3. Xanax 0.5 mg t.i.d.  4. Colace 100 mg b.i.d.  5. Hectorol 0.5 mg daily.  6. Lovenox for DVT prophylaxis.  7. Ferrous sulfate.  8. Neurontin.  9. Multivitamin.  10.Zoloft 50 mg daily.  11.Zocor 20 mg daily.  12.Flomax 0.4 mg daily.  13.Trazodone 50 mg at night.  14.Robaxin 500 mg q.6h. p.r.n.   SOCIAL HISTORY:  The patient is married and lives with is wife.  Denied  any tobacco use or alcohol use.  Also, no IV drug use.   FAMILY HISTORY:  Noncontributory but he history of heart disease,  hypertension, or stroke.   REVIEW OF SYSTEMS:  Twelve-point review of systems is significant only  for some back pain and pain in his left hip.  Otherwise, the rest is per  the HPI.   PHYSICAL EXAMINATION:  Temperature 98.5, blood pressure 105/55, pulse  80, respiratory rate 18, saturating at 96% on room air.  GENERAL:  The patient is pleasant, awake, alert, and oriented, no acute  distress.  HEENT:  PERRL.  EOMI.  He has BiPAP in place.  NECK:  Supple.  No JVD, no lymphadenopathy.  RESPIRATORY:  He has good air entry bilaterally.  No wheezes, no rales.  CARDIOVASCULAR:  He has S1, S2.  No murmurs.  ABDOMEN:  Soft and nontender.  He has positive bowel sounds.  EXTREMITIES:  No edema, cyanosis, or clubbing.   LABORATORY DATA:  His laboratories today showed a sodium 134, potassium  was 5.2, chloride 104, CO2 25, glucose 120, BUN 36, creatinine 2.77, and  a history of 89, albumin 2.9, calcium 7.9.  GFR is only 23.  CBC today,  white count 12.1, hemoglobin 11.2, and platelet count 228.   Chest x-ray today shows chronic changes but no acute cardiopulmonary  abnormality.  The prior chest x-ray yesterday showed chronic lung  changes and COPD.   ASSESSMENT:  This is a 77 year old gentleman with acute hypoxia episode.  The patient has longstanding history of chronic obstructive pulmonary  disease and chronic lung changes.  With his high dose of narcotics that  he took  yesterday, it is quite clear that his hypoxia is probably  secondary to narcotics.  He is probably getting necrotized as well as  his chronic lung disease.  Other possibilities include his chronic  obstructive lung disease acting up, but he is not really wheezing.  Also, pulmonary edema is a concern even though chest x-ray is not quite  clear, but he responded to Lasix last night.  In addition with his poor  renal function, the patient will be retaining fluids more.   PLAN/RECOMMENDATION:  1. Hypoxia.  I agree with continuing with BiPAP as needed with the      goal of titrating him down to oxygen by nasal cannula as tolerated.      If condition however  worsen, the patient will be transferred to      stepdown unit.  2. Chronic obstructive pulmonary disease.  Again, the patient is not      in any exacerbation.  I will put him on some nebulizer per family.      The patient does not tolerate albuterol; it makes him jittery.  I      will him on some Xopenex and Atrovent.  3. Hypertension seems to be controlled even though he is off      lisinopril and hydrochlorothiazide, so will watch      him closely.  4. Chronic kidney disease.  Follow his renal function closely.  Will      challenge his kidneys further again with Lasix today.      Lonia Blood, M.D.  Electronically Signed     LG/MEDQ  D:  12/02/2006  T:  12/03/2006  Job:  [id number]

## 2011-02-01 NOTE — Op Note (Signed)
   NAME:  Walter [name], Walter [name]                         ACCOUNT NO.:  [id number]   MEDICAL RECORD NO.:  [id number]                   PATIENT TYPE:  AMB   LOCATION:  ENDO                                 FACILITY:  [facility]   PHYSICIAN:  Shan Levans, M.D. [facility]            DATE OF BIRTH:  1930-08-09   DATE OF PROCEDURE:  03/29/2003  DATE OF DISCHARGE:                                 OPERATIVE REPORT   PROCEDURE PERFORMED:  Bronchoscopy.   INDICATIONS FOR PROCEDURE:  Left upper lobe volume loss with chronic  infiltrate.  Evaluate for cause.   ENDOSCOPIST:  Shan Levans, M.D. [facility].   ANESTHESIA:  1% Xylocaine local.   PREOP MEDICATION:  Demerol 30 mg IV push, Versed 7 mg IV push.   DESCRIPTION OF PROCEDURE:  The Olympus video bronchoscope was introduced  through the right naris.  The upper airways were visualized and were  unremarkable.  The entire tracheobronchial tree was visualized and revealed  no endobronchial lesions.  A mild degree of tracheobronchitis was seen.  Attention was paid to the left upper lobe orifice.  No obstruction was seen  in the airway.  Transbronchial biopsies times five were obtained from the  left upper lobe, anterior and apical posterior segments.  Bronchial washings  were obtained.   COMPLICATIONS:  None.   IMPRESSION:  Left upper lobe volume loss with chronic pulmonary infiltrate.  Evaluate for cause.  Doubt malignancy.   RECOMMENDATIONS:  Follow up pathology and microbiologic data.                                               Shan Levans, M.D. [facility]    PW/MEDQ  D:  03/29/2003  T:  03/29/2003  Job:  [id number]

## 2011-02-01 NOTE — Discharge Summary (Signed)
Walter [name], Walter [name]               ACCOUNT NO.:  [id number]   MEDICAL RECORD NO.:  [id number]          PATIENT TYPE:  INP   LOCATION:  6710                         FACILITY:  [facility]   PHYSICIAN:  Beckey Rutter, MD  DATE OF BIRTH:  Feb 05, 1930   DATE OF ADMISSION:  08/01/2006  DATE OF DISCHARGE:  08/05/2006                               DISCHARGE SUMMARY   PRIMARY NEPHROLOGIST:  Dr. Darrick Penna.   PRIMARY CARE PHYSICIAN:  Gloriajean Dell. [name], M.D. with [facility].   CHIEF COMPLAINT:  On admission:  1. Weakness.  2. Failure to thrive.  3. Fever.   HISTORY OF PRESENT ILLNESS:  As per the H&P, this is a 77 year old with  a four-day history of increasing confusion and weakness.  The patient  was found to have obstructive uropathy picture with worsening renal  function that improved during the hospital course after insertion of a  Foley catheter.   HOSPITAL COURSE:  During the hospital course, the patient was seen by  Dr. Bertram Millard. Dahlstedt from urology and he wanted him to be followed  as an out patient after the Foley catheter for further assessment and  management.  The patient's renal function was improving during the  hospital course and the patient was discharged to follow up with Dr.  Retta Diones as requested earlier.   DISCHARGE DIAGNOSES:  Obstructive uropathy.   DISCHARGE MEDICATIONS:  1. Lisinopril 40 mg a day.  2. Hydrochlorothiazide 25 mg daily.  3. Lipitor 40 mg daily.  4. Zoloft 50 mg daily.  5. Trazodone 50 mg nightly.  6. Vicodin p.r.n.   DISCHARGE CONDITION:  The patient is stable for discharge and he is  aware of the discharge planning and urologist's recommendations.      Beckey Rutter, MD  Electronically Signed     EME/MEDQ  D:  10/22/2006  T:  10/23/2006  Job:  [id number]

## 2011-02-01 NOTE — Consult Note (Signed)
Walter [name], Walter [name]               ACCOUNT NO.:  [id number]   MEDICAL RECORD NO.:  [id number]          PATIENT TYPE:  INP   LOCATION:  6710                         FACILITY:  [facility]   PHYSICIAN:  Bertram Millard. Dahlstedt, M.D.DATE OF BIRTH:  12/29/1929   DATE OF CONSULTATION:  08/02/2006  DATE OF DISCHARGE:                                 CONSULTATION   REASON FOR CONSULTATION:  Blood in urine.   BRIEF HISTORY:  This 77 year old male was admitted yesterday through the  [facility] for altered mental status.  He is undergoing workup for  that.   The patient has a prior urologic history and apparently was seen before  by Dr. Edwyna Shell in [place].  He is on Flomax.  He still has a bit of  an altered mental status, but his sister is here.  He says that he has  had some prostatic issues before but no gross hematuria.   With the patient being nonambulatory at present and having had history  of prostate problems, a catheter was placed.  Apparently catheterization  was somewhat difficult, but urine was obtained fairly expeditiously  afterwards.  He has had hematuria since placement of that catheter.  He  has had some bladder spasms as well.  He is on Lovenox at the present  time.   He does have a history of renal insufficiency, but his baseline  creatinine is apparently 1.5.  He is followed by Dr. Darrick Penna for this.  He has not been seen by Dr. Darrick Penna in the past week or so.  He has an  appointment in two days, however.   He also has a history of hip pain.  Apparently he has a hairline  fracture of one of his hips, and he is supposed to have a repair at some  time in the future.   PAST MEDICAL HISTORY:  Significant for:  1. Cervical fusion.  2. Lumbar laminectomy.  3. Cervical laminectomy.  4. Appendectomy.  5. Tonsillectomy.   MEDICATIONS:  1. Lisinopril 40 mg a day.  2. Hydrochlorothiazide 25 mg daily.  3. Lipitor 50 mg daily.  4. Sertraline 50 mg daily.  5. Trazodone  50 mg nightly.  6. Vicodin p.r.n.   ALLERGIES:  No known drug allergies.   SOCIAL HISTORY:  Married.  He lives with his wife.  He rarely drinks.  He is stopped smoking years ago.  He is a retired [employer].  He  currently lives in [place].  Dr. Benedetto Goad is his primary care  doctor.   PHYSICAL EXAMINATION:  GENERAL:  Revealed an alert but somewhat confused  elderly male.  He is in no distress.  ABDOMEN:  Protuberant, soft, nondistended, nontender.  Bladder was  nonpalpable.  No suprapubic tenderness.  GU:  Phallus is uncircumcised, retracted in suprapubic fat.  Scrotal  skin was normal.  Testicles, cords and epididymal structures normal.  RECTAL: Normal anal sphincter tone.  Gland was 3+, symmetrical, non-  nodular, nontender.  No rectal mass.  Seminal vesicles were nonpalpable.   His catheter was draining light tea-colored urine without any clots.  I  irrigated the catheter.  There were several small, stringy clots the  irrigated quite readily.  He had a bladder spasm during irrigation.   IMPRESSION:  1. Hematuria.  This could be multifactorial, possibly due to benign      prostatic hypertrophy with the traumatic catheterization.  At any      point, he seems to be draining well, and the urine irrigates clear      at the present time.  2. Benign prostatic hypertrophy, fairly large gland, maintained on      Flomax.  3. Renal insufficiency.  His baseline creatinine is 1.5, and now his      creatinine 3.0.   PLAN:  1. Will recommend renal ultrasound.  2. He will eventually need cystoscopy, but that is not an urgent      matter.  3. Continue catheterization.  I imagine he will have a little bit of      bleeding as long as the Lovenox continues.  However, a bit out from      the trauma, the urine should clear.  4. Continue bladder irrigation.  5. I will continue to follow.      Bertram Millard. Dahlstedt, M.D.  Electronically Signed     SMD/MEDQ  D:  08/02/2006   T:  08/02/2006  Job:  [id number]   cc:   Fayrene Fearing L. Deterding, M.D.  Gloriajean Dell. [name], M.D.

## 2011-02-01 NOTE — H&P (Signed)
NAMEMARGARITA, [name]               ACCOUNT NO.:  [id number]   MEDICAL RECORD NO.:  [id number]          PATIENT TYPE:  INP   LOCATION:  NA                           FACILITY:  [facility]   PHYSICIAN:  Walter [name]   DATE OF BIRTH:  11/25/1929   DATE OF ADMISSION:  12/01/2006  DATE OF DISCHARGE:                              HISTORY & PHYSICAL   PROCEDURE:  Left total hip replacement.   CHIEF COMPLAINT:  Left hip pain.   HISTORY OF PRESENT ILLNESS:  This is a 76 year old male with a history  of bilateral hip pain.  He has been refractory to all conservative  treatment.  Pain is very severe after sitting for a prolonged period of  time and first upon awakening in the morning.  This has significantly  effected his ability to function and decrease his quality of life.  We  have also requested pre-surgical clearance from his primary care doctor,  Dr. Benedetto [name], which has been kindly provided.   PAST MEDICAL HISTORY:  1. Hypertension.  2. Osteoarthritis.  3. COPD.  4. Renal insufficiency.  5. Degenerative disk disease.  6. Generalized anxiety.   PAST SURGICAL HISTORY:  1. Cervical fusion in 2000.  2. Cervical laminectomy in 2003.  3. Lumbar laminectomy in 2002.  4. Appendectomy in 1951.  5. Tonsillectomy in 1937.  6. Hemorrhoidectomy in 1951 and 1953.   FAMILY HISTORY:  Heart disease, hypertension, and stroke.   SOCIAL HISTORY:  He is married to spouse, Walter [name].   DRUG ALLERGIES:  Vicodin which causes itching and an altered level of  consciousness.   MEDICATIONS:  1. HCTZ 25 mg one p.o. daily.  2. Sertraline 50 mg one p.o. daily.  3. Lisinopril 40 mg one p.o. daily.  4. Lipitor 10 mg one p.o. daily.  5. Flomax 0.4 mg one p.o. daily.  6. Hectorol 0.5 mcg one p.o. daily.  7. Trazodone 50 mg one p.o. q.h.s.  8. Alprazolam 0.5 mg one p.o. t.i.d.  9. Gabapentin 100 mg one p.o. b.i.d.  10.Multivitamin one p.o. daily.   REVIEW OF SYSTEMS:  Renal:  Has a history of  renal insufficiency.  He is  unable to use NSAIDs per Dr. Deterding's request.  Otherwise, no current  signs or symptoms.  See history of present illness.   PHYSICAL EXAMINATION:  VITAL SIGNS:  Pulse of 60, respirations 18, blood  pressure 128/76.  GENERAL:  He is awake, alert, and oriented, well-developed and well-  nourished in no acute distress.  HEENT:  Neck is supple with no carotid bruits.  CHEST:  Lungs clear to auscultation although diminished bilaterally.  BREASTS:  Deferred.  HEART:  Regular rate and rhythm without gallops, clicks, rubs, or  murmurs.  ABDOMEN:  Soft, nontender, and nondistended.  Bowel sounds are present.  GENITOURINARY:  Deferred.  EXTREMITIES:  Painful to range of motion including internal and external  rotation.  SKIN:  Intact without cellulitis.  Dorsalis pedis pulse positive.  NEUROLOGIC:  Intact distal sensibilities.   Labs pending.  X-rays pending.  EKG pending.   IMPRESSION:  Left hip  osteoarthritis.   PLAN:  The plan of action is a left total hip replacement at [facility] on December 01, 2006.  Surgeon:  Walter [name].  Risks and  complications were discussed.  Questions were encouraged, answered, and  reviewed.           ______________________________  Walter [name], [name]     BLM/MEDQ  D:  11/26/2006  T:  11/27/2006  Job:  [id number]

## 2011-02-01 NOTE — Discharge Summary (Signed)
NAME:  Walter [name], Walter [name]                         ACCOUNT NO.:  [id number]   MEDICAL RECORD NO.:  [id number]                   PATIENT TYPE:  INP   LOCATION:  5031                                 FACILITY:  [facility]   PHYSICIAN:  Lilyan Punt. Sydnee Levans, M.D.             DATE OF BIRTH:  [birth year]   DATE OF ADMISSION:  01/10/2003  DATE OF DISCHARGE:  01/14/2003                                 DISCHARGE SUMMARY   DIAGNOSES:  1. Hypoxia.  2. Pneumonia.   FINAL DIAGNOSES:  1. Community acquired pneumonia with extensive left upper lobe infiltrates     and severe chronic obstructive pulmonary disease changes involving the     left lung.  2. Abnormal chest x-ray and abnormal CT scan needing follow up, see     procedure and findings noted below.  3. Hyperglycemia - believed secondary to systemic corticosteroids.     Hemoglobin-A1c level was 6.1.   OTHER PROBLEMS (INACTIVE):  1. Hypertension.  2. Multiple spinal surgeries.  3. Benign prostatic hyperplasia.  4. Carpal tunnel syndrome.  5. Raynaud's syndrome.  6. Chronic hand pain.  7. Hyperlipidemia.   CONSULTANTS:  Diabetic management teaching.   PROCEDURE AND FINDINGS:  CHEST X-RAY:  January 12, 2003; Confluent opacity was  noted in the left upper lobe as well as left perihilar region.  Severe COPD.  Recommendation for close radiographic follow up.   CHEST CT:  January 11, 2003; Severe changes of COPD.  Extensive infiltrative  changes seen in the left upper lobe.  Numerous bullous areas and  honeycombing within the left apex.  Infiltrative changes seen within the  superior segment of the left lower lobe medially.  There may be a mass  within the midst of these infiltrative changes within the left upper lobe  medially.  However, this could represent an infiltrate.  For this reason, a  follow up study over the next three to four weeks was advised.  There is an  enlarged mediastinal lymph node on image sequence #23 measuring 1.2 x 2.9 cm  in  size.  This may represent either reactive or neoplastic lymph node  activity.  There are smaller mediastinal lymph nodes as well.  There is no  hilar adenopathy and the trachea and mainstem bronchi show no evidence of  luminal filling defects.  There is right lower lobe scarring.  There are  incidental right renal cysts noted incidentally.  There is a small pleural  effusion present.  It is recommended patient undergo a follow up study in  three to four weeks after treatment.   HOSPITAL SUMMARY:  The patient was admitted with four day history of chronic  cough, malaise, fatigue, and had been seen April 23 in the ER but had  declined hospital admission at that time.  He presented in follow up on  January 10, 2003, to [facility] and was referred for admission  because  of hypoxia and pneumonia that had not improved with outpatient  therapy.  Admitted he was treated with IV Avelox, oxygen, IV steroids, and  underwent the CT scan noted above.  He was followed closely throughout this  hospitalization and was responding appropriately to antimicrobial therapy.  A chest x-ray on January 12, 2003, showed slight improvement on the third day  of IV therapy.  His O2 was weaned and systemic steroids were discontinued  and switched to oral medications.  He met all criteria for discharge and was  allowed to go home to finish outpatient therapy on January 14, 2003.   DISCHARGE MEDICATIONS:  1. Avelox 400 mg one daily for an additional five days.  2. Albuterol inhaler two inhalations if needed four times daily.  3. Flovent 110 two inhalations b.i.d.  4. Vicodin cough syrup as needed for bothersome cough one or two teaspoons     per dose every six hours.  Home medicines should be continued including:  1. Toprol XL 100 mg every day.  2. HCTZ 25 mg every day.  3. Elavil 50 mg every day.  4. Hydrocortisone 2% cream.   ACTIVITY:  As tolerated.   DIET:  The patient received instructions in the  diabetic teaching obtained,  and he did require some sliding scale insulin coverage while on steroids but  by the time of discharge his glucose had returned to the 140-150 range  fasting in response to diet and increased activity, and it is felt that  these will continue to normalize over the coming weeks.   FOLLOW UP:  With Dr. Lenise Arena at [facility] in 10 to 14 days.  At that time should receive a follow up CBC.  At discharge, the CBC showed a  white count of 15,000 that had improved over his admission white cell count  of 21,000.  As mentioned above, he will require chest x-ray and CT in  approximately four to six weeks' time.                                               Lilyan Punt Sydnee Levans, M.D.    KCS/MEDQ  D:  01/14/2003  T:  01/14/2003  Job:  [id number]   cc:   Joycelyn Rua, M.D.  [address] [place]  [place] [address]  Fax: [phone]

## 2011-02-01 NOTE — Discharge Summary (Signed)
NAMEVANCE, HOCHMUTH               ACCOUNT NO.:  [id number]   MEDICAL RECORD NO.:  [id number]          PATIENT TYPE:  INP   LOCATION:  5013                         FACILITY:  [facility]   PHYSICIAN:  Sharlet Salina L. [address], [place]   DATE OF BIRTH:  [birth year]   DATE OF ADMISSION:  12/01/2006  DATE OF DISCHARGE:  12/09/2006                               DISCHARGE SUMMARY   ADMITTING DIAGNOSIS:  1. Osteoarthritis.  2. Chronic obstructive pulmonary disease.  3. Renal insufficiency.  4. Degenerative disk disease.  5. Generalized anxiety.  6. Hypertension.   DISCHARGE DIAGNOSIS:  1. Osteoarthritis.  2. Status post left total hip replacement.  3. Chronic obstructive pulmonary disease.  4. Chronic kidney disease.  5. Renal insufficiency.  6. Degenerative disk disease.  7. Generalized anxiety.  8. Chronic obstructive pulmonary disease exacerbation.  9. Probable pneumonia.  10.Postoperative hyponatremia.  11.Postoperative hypokalemia.   CONSULTS:  [facility] hospitalist for multiple medical conditions  including COPD exacerbation, including hypoxia.   PROCEDURES:  Left total hip replacement with a metal liner with a metal  head.   SURGEON:  Madlyn Frankel. Charlann Boxer, M.D.   ASSISTANT:  Dwyane Luo, [facility].   BRIEF HISTORY OF PRESENT ILLNESS:  Mr. Holtman is a 77 year old male with  history of bilateral hip pain.  He was refractory to all conservative  treatments with severe pain, diminished quality of life.  Presurgically  assessed by his primary care doctor, Dr. Benedetto Goad.  He was cleared  for a left total hip replacement.   LABORATORY:  Preoperative CBC:  Hematocrit 33.3, tracked throughout,  remained stable with some minor trending downwards.  Postoperative day 1  of 28.7, then 28.3, 29.1, 28.7, 26.8 and stable.  Differential:  On  postoperative day 3 with 85 neutrophils, 9.2 absolutes -1, began to  trend down upon postoperative day 7.  Neutrophils down to 79, absolutes  9.1, lymphs at 8, monos at  13.   Chemistries on admission:  Glucose 100, creatinine 1.71.  Postoperative  day 1, had some hyperkalemia of 5.2, glucose 119, creatinine of 2.84.  Next day, trending was a little bit of hypernatremia of 133, potassium  back down to 4.9, glucose 127, kidney function up to 3.05; challenged  him with some Lasix and some fluids.  Next day, 134 sodium, 5.2  potassium, 120 glucose, 2.77 creatinine.  On the 19th, sodium 133,  potassium back down to 4.6 and remained nonelevated the rest of the  course.  No significant changes on the glucose throughout the rest of  his course.  Creatinine was 2.6.  Subsequent measure showed improved  kidney function with next day followup at 1.88, next day 1.61, next day  1.59, next day 1.6 and improved.  Calcium stayed on the low side  throughout, trending in the 7.92 to 8 level.   General chemistries showed a total protein on admission to be 5.6,  albumin 2.9, AST 89, albumin tracked at subsequently 2.4, 2.4 and  stable.  Next, blood cultures showed no growth after 5 days.  EKG  presurgical assessment showed a normal sinus rhythm.   RADIOLOGY:  On the 17th, chest 1 view:  Chronic lung changes and COPD  with no acute overlying pulmonary processes.  Postoperative portable  pelvis:  Satisfactory left hip replacement.  On the 18th, portable  chest:  Chronic lung changes.  No cardiopulmonary abnormality.  On the  18th of that evening:  No acute cardiopulmonary processes.  On the 19th,  chest 1 view:  Interval increase in left lower lobe atelectasis or  pneumonia.  On the 20th, portable chest:  Slight worsening bibasilar  aeration.   HOSPITAL COURSE:  The patient was admitted to the hospital for a left  total hip replacement.  Tolerated the procedure well, was admitted to  the orthopedic floor.  He remained neuromuscularly and vascularly intact  of his left lower extremity throughout.  The pain was well controlled  throughout.  The wound was checked on a daily  basis with wound changes  after postoperative day #1 on a daily basis.  Throughout his course  today, his wound showed no significant amount of serous oozing, and no  sign of infection.  Incision edges remained well approximated  throughout.  Medically, COPD exacerbation, multiple occurrences.  [facility] was called for a consultation to help with the hypoxia and  probable pneumonia.  Postoperative day #1, sat desatted down to the 80s.  Rapid response was called.  Afterwards, he was given some Narcan, and  his respirations returned back to near normal with sats in the 95%.  We  saw him on the 1st.  The patient was well controlled.  He did have some  muscle soreness.  We decreased his normal saline down to 30 mL per hour.  We were rechecking his BMET the next day.  We called for a rehab consult  with the probability he would not be accepted to rehab consult and  subsequently we did find out that this was indeed the case.  On the  18th, nursing note found his respirations and sats were down to 80s to  low 90s, and [facility] was called on the 18th.  They came in and saw the  patient.  They found that they wanted to give him some Lasix.  To  challenge him, they discontinued all narcotics, to use the BiPAP as  needed and give albuterol p.r.n. for breathing and continued  exacerbation.  On the 18th, they connected to the CPAP.  On the 19th,  after receiving the albuterol, the patient's had some crackles in the  lungs.  Dr. Mikeal Hawthorne notified or repeated a chest x-ray.  Had a little bit  of confusion.  We saw him on postoperative day #2.  He was still  afebrile.  Left hip had no drainage.  He was neurovascularly intact.  PT/OT weightbearing as tolerated.  We discontinued the Foley.  Rehab  consult, we were still waiting on it at that point in time.  [facility]  continued following with the probability that hypoxia was related to the narcotic use as well as exacerbation of COPD.  We wanted to make sure  he  uses incentive spirometer.  COPD:  We gave him nebulizer and Xopenex as  needed.  Chronic kidney function.  Given the Lasix which helped to begin  the creatinine down.  He had good urine output.  His hypertension was  controlled.  His confusion was probably related to his narcotic use.  His generalized anxiety was down with his Xanax.  He has normocytic  anemia related to his blood loss.  On the 19th,  his sats dropped again,  did not want to wear the CPAP.  We saw him on the 20th, was doing okay.  Primary concern at this point was his breathing in his lungs.  We  followed along with the multiple chest x-rays as well as the management  of his COPD and probable pneumonia.  [facility] saw him on the 20th.  He  had some periods of 80% sats.  He had slight worsening of the bibasilar  aeration.  We plan to check a V/Q scan and lower extremity Dopplers to  rule out PE, treat him empirically for worsening bibasilar infiltrates,  put him on Avelox 400 mg IV on a daily basis.  They started Solu-Medrol  60 mg IV every 6.  He was cleared to move to step-down unit.  V/Q scan  showed low probability for pulmonary emboli.  When we saw him on the  21st, he was doing a little better, talking a little bit more.  Upon  stabilization, he will be ready for discharge to [facility] inpatient rehab.  We will still continue to plan for that.  Agreed with the EzPAP for  breathing.  On the 21st, [facility] saw the patient.  V/Q scan showed a  low probability COPD exacerbation with possible pneumonia.  Continue  with Solu-Medrol, Avelox, and nebulizer.  His other medical conditions  were well managed.  Due to some redness on his buttocks, skin care came  in, they just recommended turning the patient.  The bilateral lower  extremity venous duplex were negative.  On the 22nd, no respiratory  distress at this point in time.  He was orthopedically stable.  When  medically cleared, ready for discharge medically.  Continue the  Solu-  Medrol, Avelox, nasal CPAP.  He still continued to be monitored for  problems with pneumonia so kept in the hospital, although when we saw  him he was doing well.  He was stable on CPAP 3 liters, nasal mask used.  The next day, the 23th, [facility] saw the patient, no acute changes.  His COPD exacerbation was improving.  He was on the 4th day of Avelox  for the possible pneumonia.  He wore the CPAP for the sleep apnea.  His  kidney function was stabilizing, and he was ready for transfer to rehab.  We saw him on the 24th.  Bed was available.  He could have been  transferred to rehab.  Rehab denied him, and so he will most likely be  discharged home on the 25th.  [facility] saw him on the 24th.  Later that  day, they signed off on him as he was stable and improving with the COPD  and possible pneumonia.  We saw the patient on the 25th.  He was doing  better, and his left hip had some ecchymosis but otherwise was stable. He was ready for discharge home with home health care, physical therapy.  All future meds were ordered for the patient and advanced home care was  selected.   DISCHARGE DISPOSITION:  Stable and improved.   DISCHARGE PHYSICAL THERAPY:  Weightbearing as tolerated, left lower  extremity.  Goals of physical therapy will be work on gait training,  proprioception, minimize pain, maximize strength, increase for range of  motion.  I encouraged his activity of daily living.   DISCHARGE WOUND CARE:  Keep wound dry.  Change dressing on a daily  basis.  Return to office for wound care check.   DISCHARGE FOLLOWUP:  Followup  with Dr. Wadie Lessen office, [address], in 2  weeks.   DISCHARGE DIET:  Regular as tolerated by patient.   DISCHARGE MEDICATIONS:  HCTZ, sertraline, lisinopril, Lipitor, Flomax,  Hectorol, trazodone, Xanax, gabapentin, and multivitamin.   DISCHARGE MEDICATIONS:  1. Lovenox 40 mg subcu q.24 x11 days.  2. Darvocet-N 100 one to two p.o. every 4 to 6 hours  p.r.n. pain.  3. Robaxin 500 mg 1 p.o. every 4 to 6 to 8 muscle spasm.  4. Colace 100 mg one p.o. b.i.d. constipation.  5. Iron sulfate 325 mg one p.o. t.i.d. x3 weeks.  6. Enteric-coated aspirin 325 1 p.o. daily after Lovenox.  7. Prednisone taper as directed.  8. Avelox antibiotics x3 additional days then off.           ______________________________  Yetta Glassman. [address], [place]     BLM/MEDQ  D:  02/02/2007  T:  02/02/2007  Job:  [id number]

## 2011-02-01 NOTE — Op Note (Signed)
NAMEJAKEVIOUS, HOLLISTER               ACCOUNT NO.:  [id number]   MEDICAL RECORD NO.:  [id number]          PATIENT TYPE:  INP   LOCATION:  5017                         FACILITY:  [facility]   PHYSICIAN:  Madlyn Frankel. Charlann Boxer, M.D.  DATE OF BIRTH:  Dec 29, 1929   DATE OF PROCEDURE:  12/01/2006  DATE OF DISCHARGE:                               OPERATIVE REPORT   PREOPERATIVE DIAGNOSIS:  Left hip end-stage osteoarthritis.   POSTOPERATIVE DIAGNOSIS:  Left hip end-stage osteoarthritis.   PROCEDURE:  Left total hip replacement.   COMPONENTS USED:  DePuy hip system with a size 58 Pinnacle cup, two  cancellous bone screws, and neutral 36 marathon liner.  A Tri-Lock 13.8  lateralized offset stem with a 36 +1.5 ceramic Biolox Delta head.   SURGEON:  Madlyn Frankel. Charlann Boxer, M.D.   ASSISTANT:  Dwyane Luo, PA-C   ANESTHESIA:  General.   BLOOD LOSS:  300 mL.   DRAINS:  None.   COMPLICATIONS:  None.   INDICATION FOR PROCEDURE:  Mr. Seavey is a 77 year old gentleman who was  followed in the office for left hip pain.  He had radiograph findings  for advanced degenerative changes with progressive pain that was  limiting his functional capacity.  He wished to proceed with surgical  intervention due to decreased quality of life.  Upon obtaining medical  clearance and stability, risks of infection, dislocation, component  failure, and need for revision surgery were all reviewed as well as the  postoperative course and risks associated with hip precautions.   Consent was obtained.   PROCEDURE IN DETAIL:  The patient was brought to the operative theater.  Once adequate anesthesia and preoperative antibiotics, 2 g of Ancef,  were administered, the patient was positioned in the right lateral  decubitus position with the left side up.  The left lower extremity  prescrubbed and prepped and draped in a sterile routine fashion.  A  posterior lateral incision was made for a posterior approach to the hip.  The iliotibial  band and gluteus fascia was incised in the line of the  incision.  Short external rotators were identified and taken down  separately from the posterior capsule.  An L capsulotomy was created to  save the posterior capsule for anatomic repair postop as well as  protection against the sciatic nerve with retraction.  The arthritic  joint was identified, the hip dislocated.  The patient was noted to have  a varus neck on the preoperative radiographs and a neck osteotomy was  made to correlate with this.   Neck osteotomy was made and attention was first directed to the femur.  Femoral exposure was obtained including debridement of osteophytes on  the anterior aspect the femur about.  I then used a box osteotome and  set rotation at 20-25 degrees of anteversion.  I then hand-reamed and  irrigated the canal prevent fat emboli then began broaching with an 8.8  broach.  I carried this all the way up to a 13.8 broach, where I had  good metaphyseal locking.  At this point I packed the femur and went to  the acetabulum.  Acetabular exposure was obtained and the patient was  noted to have some calcified labrum.  I debrided some this posteriorly.  There were also osteophytes posteriorly.  I then began reaming with a 47  reamer down the medial wall then began reaming up and went all way up to  a 57 reamer with good bony bed preparation.  At this point a final 58-mm  cup was impacted.  It was at 15-20 degrees of anteversion, where it was  beneath the anterior wall anteriorly by probably 8 mm.  It seemed level  with the ischium posteriorly and seemed to be well-covered throughout.   At this point based on this positioning, two cancellous bone screws were  placed and a trial liner placed.   At this point a trial reduction was carried out with 13.8 stem.  Note  that I did use the calcar miller to mill out some of the anterior bone.  With the lateralized-offset neck and a 36 +1.5, the patient's hip   mobility significantly improved from his preoperative exam, where he was  very tight.  He had a little bit of subluxation, not until about 70  degrees of internal rotation, and I felt this was tolerable.  His  combined anteversion was about 45 degrees with this femoral and  acetabular position.  Leg lengths as compared to the preoperative state  appeared to be equal and where they were when I first examined in the  preoperative position.  Given this, we went ahead and removed the trial  components.  The final 36 neutral Marathon liner was impacted into a  clean and dried cup with a central hole cover placed.   Following this the final 13.8 Tri-Lock lateralized-offset stem was then  impacted to the level where the broach was placed.  Trial reduction was  then carried out.  I was happy with this position, length and stability  and for this reason we impacted a 36 +1.5 ceramic Biolox Delta ceramic  head onto the trunion.  The hip was reduced, irrigated throughout the  case, again this point.  I reapproximated the posterior capsule to the  superior leaflet with a #1 Ethibond.  The Ethibond was then used on the  iliotibial band and a running #1 Vicryl in the gluteal fascia.  The  remainder of the wound was closed with 2-0 Vicryl and running 4-0  Monocryl.  The hip was cleaned, dried, and dressed sterilely with Steri-  Strips and a Mepilex dressing.  The patient was then extubated and  brought to recovery room in stable condition.      Madlyn Frankel Charlann Boxer, M.D.  Electronically Signed     MDO/MEDQ  D:  12/01/2006  T:  12/01/2006  Job:  [id number]

## 2011-02-12 ENCOUNTER — Other Ambulatory Visit (HOSPITAL_COMMUNITY): Payer: Self-pay | Admitting: Neurology

## 2011-02-12 DIAGNOSIS — R2681 Unsteadiness on feet: Secondary | ICD-10-CM

## 2011-02-12 DIAGNOSIS — R413 Other amnesia: Secondary | ICD-10-CM

## 2011-02-13 ENCOUNTER — Ambulatory Visit (HOSPITAL_COMMUNITY)
Admission: RE | Admit: 2011-02-13 | Discharge: 2011-02-13 | Disposition: A | Discharge status: Home / Self Care | Payer: Medicare Other | Source: Ambulatory Visit | Attending: Neurology | Admitting: Neurology

## 2011-02-13 DIAGNOSIS — R2681 Unsteadiness on feet: Secondary | ICD-10-CM

## 2011-02-13 DIAGNOSIS — R269 Unspecified abnormalities of gait and mobility: Secondary | ICD-10-CM | POA: Insufficient documentation

## 2011-02-13 DIAGNOSIS — Z9181 History of falling: Secondary | ICD-10-CM | POA: Insufficient documentation

## 2011-02-13 DIAGNOSIS — R413 Other amnesia: Secondary | ICD-10-CM | POA: Insufficient documentation

## 2012-01-14 ENCOUNTER — Emergency Department (HOSPITAL_COMMUNITY): Payer: Medicare Other

## 2012-01-14 ENCOUNTER — Inpatient Hospital Stay (HOSPITAL_COMMUNITY)
Admission: EM | Admit: 2012-01-14 | Discharge: 2012-01-18 | DRG: 190 | Disposition: A | Discharge status: Home Health | Payer: Medicare Other | Attending: Family Medicine | Admitting: Family Medicine

## 2012-01-14 ENCOUNTER — Encounter (HOSPITAL_COMMUNITY): Payer: Self-pay | Admitting: *Deleted

## 2012-01-14 DIAGNOSIS — D72829 Elevated white blood cell count, unspecified: Secondary | ICD-10-CM | POA: Diagnosis present

## 2012-01-14 DIAGNOSIS — N179 Acute kidney failure, unspecified: Secondary | ICD-10-CM | POA: Diagnosis present

## 2012-01-14 DIAGNOSIS — E1322 Other specified diabetes mellitus with diabetic chronic kidney disease: Secondary | ICD-10-CM | POA: Diagnosis present

## 2012-01-14 DIAGNOSIS — R739 Hyperglycemia, unspecified: Secondary | ICD-10-CM

## 2012-01-14 DIAGNOSIS — E785 Hyperlipidemia, unspecified: Secondary | ICD-10-CM

## 2012-01-14 DIAGNOSIS — N183 Chronic kidney disease, stage 3 unspecified: Secondary | ICD-10-CM | POA: Diagnosis present

## 2012-01-14 DIAGNOSIS — J438 Other emphysema: Secondary | ICD-10-CM

## 2012-01-14 DIAGNOSIS — J449 Chronic obstructive pulmonary disease, unspecified: Secondary | ICD-10-CM

## 2012-01-14 DIAGNOSIS — R7309 Other abnormal glucose: Secondary | ICD-10-CM | POA: Diagnosis not present

## 2012-01-14 DIAGNOSIS — R531 Weakness: Secondary | ICD-10-CM | POA: Diagnosis present

## 2012-01-14 DIAGNOSIS — J441 Chronic obstructive pulmonary disease with (acute) exacerbation: Secondary | ICD-10-CM | POA: Diagnosis present

## 2012-01-14 DIAGNOSIS — E875 Hyperkalemia: Secondary | ICD-10-CM | POA: Diagnosis present

## 2012-01-14 DIAGNOSIS — I10 Essential (primary) hypertension: Secondary | ICD-10-CM | POA: Diagnosis present

## 2012-01-14 DIAGNOSIS — I129 Hypertensive chronic kidney disease with stage 1 through stage 4 chronic kidney disease, or unspecified chronic kidney disease: Secondary | ICD-10-CM | POA: Diagnosis present

## 2012-01-14 DIAGNOSIS — T380X5A Adverse effect of glucocorticoids and synthetic analogues, initial encounter: Secondary | ICD-10-CM | POA: Diagnosis not present

## 2012-01-14 DIAGNOSIS — W19XXXA Unspecified fall, initial encounter: Secondary | ICD-10-CM | POA: Diagnosis present

## 2012-01-14 DIAGNOSIS — J189 Pneumonia, unspecified organism: Secondary | ICD-10-CM | POA: Diagnosis present

## 2012-01-14 HISTORY — DX: Chronic obstructive pulmonary disease, unspecified: J44.9

## 2012-01-14 HISTORY — DX: Benign prostatic hyperplasia without lower urinary tract symptoms: N40.0

## 2012-01-14 HISTORY — DX: Polyneuropathy, unspecified: G62.9

## 2012-01-14 HISTORY — DX: Essential (primary) hypertension: I10

## 2012-01-14 HISTORY — DX: Disorder of kidney and ureter, unspecified: N28.9

## 2012-01-14 HISTORY — DX: Gastro-esophageal reflux disease without esophagitis: K21.9

## 2012-01-14 LAB — DIFFERENTIAL
Basophils Absolute: 0 10*3/uL (ref 0.0–0.1)
Lymphocytes Relative: 4 % — ABNORMAL LOW (ref 12–46)
Lymphs Abs: 1.1 10*3/uL (ref 0.7–4.0)
Monocytes Relative: 8 % (ref 3–12)

## 2012-01-14 LAB — CBC
Platelets: 393 10*3/uL (ref 150–400)
RDW: 15.7 % — ABNORMAL HIGH (ref 11.5–15.5)
WBC: 27.3 10*3/uL — ABNORMAL HIGH (ref 4.0–10.5)

## 2012-01-14 LAB — CARDIAC PANEL(CRET KIN+CKTOT+MB+TROPI)
Relative Index: INVALID (ref 0.0–2.5)
Troponin I: <0.3 ng/mL (ref <0.30)

## 2012-01-14 LAB — APTT: aPTT: 27 seconds (ref 24–37)

## 2012-01-14 LAB — HEPATIC FUNCTION PANEL
AST: 18 U/L (ref 0–37)
Bilirubin, Direct: <0.1 mg/dL (ref 0.0–0.3)

## 2012-01-14 LAB — BASIC METABOLIC PANEL
Chloride: 109 mEq/L (ref 96–112)
Creatinine, Ser: 1.85 mg/dL — ABNORMAL HIGH (ref 0.50–1.35)
GFR calc Af Amer: 38 mL/min — ABNORMAL LOW (ref >90)
GFR calc non Af Amer: 33 mL/min — ABNORMAL LOW (ref >90)
Potassium: 5.5 mEq/L — ABNORMAL HIGH (ref 3.5–5.1)

## 2012-01-14 LAB — URINALYSIS, ROUTINE W REFLEX MICROSCOPIC
Ketones, ur: NEGATIVE mg/dL
Leukocytes, UA: NEGATIVE
Nitrite: NEGATIVE
Urobilinogen, UA: 0.2 mg/dL (ref 0.0–1.0)
pH: 6.5 (ref 5.0–8.0)

## 2012-01-14 LAB — PROTIME-INR: Prothrombin Time: 12.7 seconds (ref 11.6–15.2)

## 2012-01-14 MED ORDER — ALBUTEROL SULFATE (5 MG/ML) 0.5% IN NEBU
2.5000 mg | INHALATION_SOLUTION | Freq: Four times a day (QID) | RESPIRATORY_TRACT | Status: DC
  Administered 2012-01-15 (×3): 2.5 mg via RESPIRATORY_TRACT
  Filled 2012-01-14 (×4): qty 0.5

## 2012-01-14 MED ORDER — OCUVITE PO TABS
2.0000 | ORAL_TABLET | Freq: Every day | ORAL | Status: DC
  Administered 2012-01-14 – 2012-01-18 (×5): 2 via ORAL
  Filled 2012-01-14 (×5): qty 2

## 2012-01-14 MED ORDER — SODIUM POLYSTYRENE SULFONATE 15 GM/60ML PO SUSP
15.0000 g | Freq: Once | ORAL | Status: AC
  Administered 2012-01-15: 15 g via ORAL
  Filled 2012-01-14: qty 60

## 2012-01-14 MED ORDER — SILODOSIN 8 MG PO CAPS
8.0000 mg | ORAL_CAPSULE | Freq: Every day | ORAL | Status: DC
  Administered 2012-01-15 – 2012-01-18 (×4): 8 mg via ORAL
  Filled 2012-01-14 (×5): qty 1

## 2012-01-14 MED ORDER — GUAIFENESIN-DM 100-10 MG/5ML PO SYRP: 5.0000 mL | ORAL_SOLUTION | ORAL | Status: DC | PRN

## 2012-01-14 MED ORDER — TAMSULOSIN HCL 0.4 MG PO CAPS
0.4000 mg | ORAL_CAPSULE | Freq: Every day | ORAL | Status: DC
  Administered 2012-01-14 – 2012-01-18 (×5): 0.4 mg via ORAL
  Filled 2012-01-14 (×5): qty 1

## 2012-01-14 MED ORDER — TRAZODONE HCL 50 MG PO TABS
50.0000 mg | ORAL_TABLET | Freq: Every day | ORAL | Status: DC
  Administered 2012-01-14 – 2012-01-17 (×4): 50 mg via ORAL
  Filled 2012-01-14 (×5): qty 1

## 2012-01-14 MED ORDER — ACETAMINOPHEN 650 MG RE SUPP: 650.0000 mg | Freq: Four times a day (QID) | RECTAL | Status: DC | PRN

## 2012-01-14 MED ORDER — ACETAMINOPHEN 325 MG PO TABS
650.0000 mg | ORAL_TABLET | Freq: Four times a day (QID) | ORAL | Status: DC | PRN
  Administered 2012-01-14 – 2012-01-17 (×3): 650 mg via ORAL
  Filled 2012-01-14 (×3): qty 2

## 2012-01-14 MED ORDER — METHYLPREDNISOLONE SODIUM SUCC 125 MG IJ SOLR
80.0000 mg | Freq: Two times a day (BID) | INTRAMUSCULAR | Status: DC
  Administered 2012-01-15: 80 mg via INTRAVENOUS
  Filled 2012-01-14 (×3): qty 1.28

## 2012-01-14 MED ORDER — ALBUTEROL SULFATE (5 MG/ML) 0.5% IN NEBU
5.0000 mg | INHALATION_SOLUTION | Freq: Once | RESPIRATORY_TRACT | Status: AC
  Administered 2012-01-14: 5 mg via RESPIRATORY_TRACT
  Filled 2012-01-14: qty 1

## 2012-01-14 MED ORDER — LEVOFLOXACIN IN D5W 500 MG/100ML IV SOLN
500.0000 mg | INTRAVENOUS | Status: DC
  Administered 2012-01-14 – 2012-01-16 (×3): 500 mg via INTRAVENOUS
  Filled 2012-01-14 (×4): qty 100

## 2012-01-14 MED ORDER — ONDANSETRON HCL 4 MG PO TABS: 4.0000 mg | ORAL_TABLET | Freq: Four times a day (QID) | ORAL | Status: DC | PRN

## 2012-01-14 MED ORDER — VITAMIN D3 25 MCG (1000 UNIT) PO TABS
1000.0000 [IU] | ORAL_TABLET | Freq: Every day | ORAL | Status: DC
  Administered 2012-01-14 – 2012-01-18 (×5): 1000 [IU] via ORAL
  Filled 2012-01-14 (×5): qty 1

## 2012-01-14 MED ORDER — TIOTROPIUM BROMIDE MONOHYDRATE 18 MCG IN CAPS
18.0000 ug | ORAL_CAPSULE | Freq: Every day | RESPIRATORY_TRACT | Status: DC
  Administered 2012-01-15 – 2012-01-18 (×4): 18 ug via RESPIRATORY_TRACT
  Filled 2012-01-14: qty 5

## 2012-01-14 MED ORDER — ALPRAZOLAM 0.5 MG PO TABS
0.5000 mg | ORAL_TABLET | Freq: Every evening | ORAL | Status: DC | PRN
  Administered 2012-01-14 – 2012-01-17 (×4): 0.5 mg via ORAL
  Filled 2012-01-14 (×4): qty 1

## 2012-01-14 MED ORDER — SODIUM CHLORIDE 0.9 % IV SOLN
INTRAVENOUS | Status: DC
  Administered 2012-01-15: 1000 mL via INTRAVENOUS

## 2012-01-14 MED ORDER — GABAPENTIN 100 MG PO CAPS
100.0000 mg | ORAL_CAPSULE | Freq: Two times a day (BID) | ORAL | Status: DC
  Administered 2012-01-14 – 2012-01-18 (×8): 100 mg via ORAL
  Filled 2012-01-14 (×9): qty 1

## 2012-01-14 MED ORDER — SODIUM CHLORIDE 0.9 % IV BOLUS (SEPSIS)
500.0000 mL | Freq: Once | INTRAVENOUS | Status: AC
  Administered 2012-01-14: 1000 mL via INTRAVENOUS

## 2012-01-14 MED ORDER — ALBUTEROL SULFATE (5 MG/ML) 0.5% IN NEBU: 2.5000 mg | INHALATION_SOLUTION | RESPIRATORY_TRACT | Status: DC | PRN

## 2012-01-14 MED ORDER — ONDANSETRON HCL 4 MG/2ML IJ SOLN: 4.0000 mg | Freq: Four times a day (QID) | INTRAMUSCULAR | Status: DC | PRN

## 2012-01-14 MED ORDER — PANTOPRAZOLE SODIUM 40 MG PO TBEC
40.0000 mg | DELAYED_RELEASE_TABLET | Freq: Every day | ORAL | Status: DC
  Administered 2012-01-14 – 2012-01-17 (×4): 40 mg via ORAL
  Filled 2012-01-14 (×5): qty 1

## 2012-01-14 MED ORDER — ALUM & MAG HYDROXIDE-SIMETH 200-200-20 MG/5ML PO SUSP: 30.0000 mL | Freq: Four times a day (QID) | ORAL | Status: DC | PRN

## 2012-01-14 MED ORDER — POLYETHYLENE GLYCOL 3350 17 G PO PACK
17.0000 g | PACK | Freq: Every day | ORAL | Status: DC | PRN
  Administered 2012-01-17: 17 g via ORAL
  Filled 2012-01-14 (×2): qty 1

## 2012-01-14 MED ORDER — HYDROCODONE-ACETAMINOPHEN 5-325 MG PO TABS
1.0000 | ORAL_TABLET | ORAL | Status: DC | PRN
  Administered 2012-01-15: 1 via ORAL
  Filled 2012-01-14: qty 1

## 2012-01-14 MED ORDER — METHYLPREDNISOLONE SODIUM SUCC 125 MG IJ SOLR
125.0000 mg | Freq: Once | INTRAMUSCULAR | Status: AC
  Administered 2012-01-14: 125 mg via INTRAVENOUS
  Filled 2012-01-14: qty 2

## 2012-01-14 MED ORDER — FAMOTIDINE 20 MG PO TABS
20.0000 mg | ORAL_TABLET | Freq: Every day | ORAL | Status: DC
  Administered 2012-01-14 – 2012-01-17 (×4): 20 mg via ORAL
  Filled 2012-01-14 (×5): qty 1

## 2012-01-14 NOTE — ED Provider Notes (Signed)
History     CSN: [id number]  Arrival date & time 01/14/12  1348   First MD Initiated Contact with Patient 01/14/12 1505      Chief Complaint  Patient presents with  . Fall  . Urinary Frequency    (Consider location/radiation/quality/duration/timing/severity/associated sxs/prior treatment) HPI Pt found by family on the floor by the bed incontinent of urine and stool and unable to get up. Details are vague but pt denies LOC or hitting head or neck. Family states that over the last few days he has been getting increasingly weak and confused. Denies fever, chills, increased SOB or cough, CP, abd pain, N/V/D, lower ext swelling. Admit to decreased urination.  Past Medical History  Diagnosis Date  . COPD (chronic obstructive pulmonary disease)   . Renal disorder   . Hypertension     History reviewed. No pertinent past surgical history.  History reviewed. No pertinent family history.  History  Substance Use Topics  . Smoking status: Former Games developer  . Smokeless tobacco: Not on file  . Alcohol Use: No      Review of Systems  Constitutional: Negative for fever and chills.  HENT: Negative for neck pain.   Respiratory: Negative for cough, shortness of breath and wheezing.   Cardiovascular: Negative for chest pain, palpitations and leg swelling.  Gastrointestinal: Negative for nausea, vomiting, abdominal pain and diarrhea.  Genitourinary: Positive for difficulty urinating. Negative for frequency and hematuria.  Musculoskeletal: Negative for myalgias, back pain and arthralgias.  Skin: Negative for rash and wound.  Neurological: Positive for weakness (generalized weakness, no focal ) and headaches. Negative for dizziness, seizures, syncope, light-headedness and numbness.    Allergies  Review of patient's allergies indicates no known allergies.  Home Medications   Current Outpatient Rx  Name Route Sig Dispense Refill  . ALPRAZOLAM 0.5 MG PO TABS Oral Take 0.5 mg by mouth at  bedtime as needed. Anxiety    . OCUVITE PO TABS Oral Take 2 tablets by mouth daily.    Marland Kitchen VITAMIN D 1000 UNITS PO TABS Oral Take 1,000 Units by mouth daily.    Marland Kitchen GABAPENTIN 100 MG PO CAPS Oral Take 100 mg by mouth 2 (two) times daily.    Marland Kitchen LISINOPRIL 40 MG PO TABS Oral Take 40 mg by mouth daily.    Marland Kitchen OMEPRAZOLE 20 MG PO CPDR Oral Take 20 mg by mouth daily.    Marland Kitchen RANITIDINE HCL 300 MG PO TABS Oral Take 300 mg by mouth at bedtime.    Marland Kitchen SILODOSIN 8 MG PO CAPS Oral Take 8 mg by mouth daily with breakfast.    . TAMSULOSIN HCL 0.4 MG PO CAPS Oral Take 0.4 mg by mouth.    Marland Kitchen TIOTROPIUM BROMIDE MONOHYDRATE 18 MCG IN CAPS Inhalation Place 18 mcg into inhaler and inhale daily.    . TRAZODONE HCL 50 MG PO TABS Oral Take 50 mg by mouth at bedtime.      BP 130/65  Pulse 87  Temp(Src) 98 F (36.7 C) (Oral)  Resp 28  SpO2 97%  Physical Exam  Nursing note and vitals reviewed. Constitutional: He is oriented to person, place, and time. He appears well-developed and well-nourished. No distress.  HENT:  Head: Normocephalic and atraumatic.  Mouth/Throat: Oropharynx is clear and moist.  Eyes: EOM are normal. Pupils are equal, round, and reactive to light.  Neck: Normal range of motion. Neck supple.       No post cervical midline TTP  Cardiovascular: Normal rate and regular  rhythm.   Pulmonary/Chest: No respiratory distress. He has no wheezes. He has rales.       Increased resp effort, using accessory muscles, Crackle in R base  Abdominal: Soft. Bowel sounds are normal. There is no tenderness. There is no rebound and no guarding.  Musculoskeletal: Normal range of motion. He exhibits no edema and no tenderness.       R > L calf swelling without pain  Neurological: He is alert and oriented to person, place, and time.       4/5 motor in all ext, sensation intact  Skin: Skin is warm and dry. No rash noted. No erythema.  Psychiatric: He has a normal mood and affect. His behavior is normal.    ED Course    Procedures (including critical care time)  Labs Reviewed  CBC - Abnormal; Notable for the following:    WBC 27.3 (*)    RBC 3.99 (*)    Hemoglobin 12.1 (*)    HCT 37.7 (*)    RDW 15.7 (*)    All other components within normal limits  DIFFERENTIAL - Abnormal; Notable for the following:    Neutrophils Relative 88 (*)    Lymphocytes Relative 4 (*)    Neutro Abs 24.0 (*)    Monocytes Absolute 2.2 (*)    All other components within normal limits  BASIC METABOLIC PANEL - Abnormal; Notable for the following:    Potassium 5.5 (*)    Glucose, Bld 105 (*)    BUN 32 (*)    Creatinine, Ser 1.85 (*)    GFR calc non Af Amer 33 (*)    GFR calc Af Amer 38 (*)    All other components within normal limits  URINALYSIS, ROUTINE W REFLEX MICROSCOPIC - Abnormal; Notable for the following:    Hgb urine dipstick TRACE (*)    Protein, ur 100 (*)    All other components within normal limits  URINE MICROSCOPIC-ADD ON  PROTIME-INR  CARDIAC PANEL(CRET KIN+CKTOT+MB+TROPI)  APTT  CULTURE, BLOOD (ROUTINE X 2)  CULTURE, BLOOD (ROUTINE X 2)   Ct Head Wo Contrast  01/14/2012  *RADIOLOGY REPORT*  Clinical Data: Fall, confusion, incontinence  CT HEAD WITHOUT CONTRAST  Technique:  Contiguous axial images were obtained from the base of the skull through the vertex without contrast.  Comparison: 02/13/2011  Findings: No evidence of parenchymal hemorrhage or extra-axial fluid collection. No mass lesion, mass effect, or midline shift.  No CT evidence of acute infarction.  Extensive small vessel ischemic changes.  Intracranial atherosclerosis.  Global cortical atrophy.  Secondary ventricular prominence, unchanged from prior studies.  Mucous retention cyst in the bilateral maxillary sinuses.  The visualized paranasal sinuses are otherwise clear.  Mild partial opacification of the right mastoid air cells, chronic.  No evidence of calvarial fracture.  IMPRESSION: No evidence of acute intracranial abnormality.  Atrophy with  stable ventricular prominence.  Extensive small vessel ischemic changes and intracranial atherosclerosis.  Original Report Authenticated By: Charline Bills, M.D.   Dg Chest Port 1 View  01/14/2012  *RADIOLOGY REPORT*  Clinical Data: Difficulty breathing.  PORTABLE CHEST - 1 VIEW  Comparison: 10/24/2010  Findings: Patchy bilateral lower lobe airspace opacities are again noted.  These have slightly increased since prior study.  Cannot exclude acute infiltrates.  Underlying COPD.  Heart is normal size. No effusions or acute bony abnormality.  IMPRESSION: Patchy bilateral lower lobe opacities, right greater than left. Cannot exclude pneumonia.  COPD.  Original Report Authenticated By: Aubery Lapping  DOVER, M.D.     1. Community acquired pneumonia   2. Fall      Date: 01/14/2012  Rate: 84  Rhythm: normal sinus rhythm  QRS Axis: normal  Intervals: PR prolonged  ST/T Wave abnormalities: normal  Conduction Disutrbances:first-degree A-V block  and right bundle branch block  Narrative Interpretation:   Old EKG Reviewed: unchanged    MDM   Discussed with triad. Will admit.        Loren Racer, MD 01/14/12 1800

## 2012-01-14 NOTE — ED Notes (Signed)
Wife states that any narcotic and Ultram causes pt to become antagonistic, confused, falls down

## 2012-01-14 NOTE — ED Notes (Addendum)
Walter [name] --- [phone] wife.

## 2012-01-14 NOTE — ED Notes (Signed)
Wife states patient fell this am between bed and desk and couldn't get up-- has not  [place] in a year, had been incontinent of stool and urine in bed and on the floor. Is not incontinent

## 2012-01-14 NOTE — H&P (Signed)
Hospital Admission Note Date: 01/14/2012  Patient name: Walter [name] Medical record number: [id number] Date of birth: 1930/08/24 Age: 78 y.o. Gender: male PCP: Walter Inch, MD, MD  Attending physician: Maryruth Bun Walter Cambre, MD Emergency Contact: Walter [name] (wife) [phone] Code Status:  Full  Chief Complaint: Fall  History of Present Illness: Walter [name] is an 78 y.o. male with a PMH of COPD who states that he fell when he attempted to get up and fell to the floor.  He was unable to get up on his own.  Denies any dizziness or loss of consciousness.  No complaints of shortness of breath, no fever or chills, no cough.  No dysuria or diarrhea. The patient does not have any specific complaints.  Initial evaluation by the emergency department physician, a chest x-ray was done which showed possible pneumonia and he has been referred inpatient treatment.  Past Medical History Past Medical History  Diagnosis Date  . COPD (chronic obstructive pulmonary disease)   . Renal disorder   . Hypertension   . BPH (benign prostatic hyperplasia)   . GERD (gastroesophageal reflux disease)   . Neuropathy     Past Surgical History Past Surgical History  Procedure Date  . Tonsillectomy   . Appendectomy   . Hemorrhoid surgery     Meds: Prior to Admission medications   Medication Sig Start Date End Date Taking? Authorizing Provider  ALPRAZolam Prudy Feeler) 0.5 MG tablet Take 0.5 mg by mouth at bedtime as needed. Anxiety   Yes Historical Provider, MD  beta carotene w/minerals (OCUVITE) tablet Take 2 tablets by mouth daily.   Yes Historical Provider, MD  cholecalciferol (VITAMIN D) 1000 UNITS tablet Take 1,000 Units by mouth daily.   Yes Historical Provider, MD  gabapentin (NEURONTIN) 100 MG capsule Take 100 mg by mouth 2 (two) times daily.   Yes Historical Provider, MD  lisinopril (PRINIVIL,ZESTRIL) 40 MG tablet Take 40 mg by mouth daily.   Yes Historical Provider, MD  omeprazole (PRILOSEC) 20 MG  capsule Take 20 mg by mouth daily.   Yes Historical Provider, MD  ranitidine (ZANTAC) 300 MG tablet Take 300 mg by mouth at bedtime.   Yes Historical Provider, MD  silodosin (RAPAFLO) 8 MG CAPS capsule Take 8 mg by mouth daily with breakfast.   Yes Historical Provider, MD  Tamsulosin HCl (FLOMAX) 0.4 MG CAPS Take 0.4 mg by mouth.   Yes Historical Provider, MD  tiotropium (SPIRIVA) 18 MCG inhalation capsule Place 18 mcg into inhaler and inhale daily.   Yes Historical Provider, MD  traZODone (DESYREL) 50 MG tablet Take 50 mg by mouth at bedtime.   Yes Historical Provider, MD    Allergies: Review of patient's allergies indicates no known allergies.  Social History: History   Social History  . Marital Status: Married    Spouse Name: Walter [name]    Number of Children: 2  . Years of Education: N/A   Occupational History  . Retired Merchandiser, retail    Social History Main Topics  . Smoking status: Former Games developer  . Smokeless tobacco: Never Used  . Alcohol Use: No  . Drug Use: No  . Sexually Active:    Other Topics Concern  . Not on file   Social History Narrative   Married.  Lives with wife.  Ambulates without assistance.    Family History:  Family History  Problem Relation Age of Onset  . Alcohol abuse Father   . Heart failure Father     Review of  Systems: Constitutional: No fever or chills;  Appetite normal; No weight loss.  HEENT: No blurry vision or diplopia, no pharyngitis or dysphagia; Wears glasses. CV: No chest pain or arrhythmia.  Resp: No SOB, no cough. GI: No N/V, no diarrhea, no melena or hematochezia.  GU: No dysuria or hematuria.  MSK: no myalgias/arthralgias.  Neuro:  +headache right now, no focal neurological deficits.  Psych: No depression or anxiety.  Endo: No thyroid disease or DM.  Skin: No rashes or lesions.  Heme: No anemia or blood dyscrasia   Physical Exam: Blood pressure 130/65, pulse 87, temperature 98 F (36.7 C), temperature source Oral, resp. rate  28, SpO2 97.00%. BP 130/65  Pulse 87  Temp(Src) 98 F (36.7 C) (Oral)  Resp 28  SpO2 97%  General Appearance:    Alert, cooperative, no distress, appears stated age  Head:    Normocephalic, without obvious abnormality, atraumatic  Eyes:    PERRL, conjunctiva/corneas clear, EOM's intact  Ears:    Normal external ear canals, both ears  Nose:   Nares normal, septum midline, mucosa normal, no drainage    or sinus tenderness  Throat:   Lips, mucosa, and tongue normal; dentures in place  Neck:   Supple, symmetrical, trachea midline, no adenopathy;       thyroid:  No enlargement/tenderness/nodules; no carotid   bruit or JVD  Back:     Symmetric, no curvature, ROM normal, no CVA tenderness  Lungs:     Diminished bilaterally, respirations unlabored, a few wheezes audible.  Chest wall:    No tenderness or deformity  Heart:    Regular rate and rhythm, S1 and S2 normal, no murmur, rub   or gallop  Abdomen:     Soft, non-tender, bowel sounds active all four quadrants,    no masses, no organomegaly  Extremities:   Extremities normal, atraumatic, no cyanosis or edema  Pulses:   2+ and symmetric all extremities  Skin:   Skin color, texture, turgor normal, no rashes or lesions  Lymph nodes:   Cervical, supraclavicular, and axillary nodes normal  Neurologic:   CNII-XII intact. Generalized weakness.   Lab results: Basic Metabolic Panel:  Lab 01/14/12 9562  NA 143  K 5.5*  CL 109  CO2 27  GLUCOSE 105*  BUN 32*  CREATININE 1.85*  CALCIUM 8.7  MG --  PHOS --   GFR The CrCl is unknown because both a height and weight (above a minimum accepted value) are required for this calculation. Coagulation profile  Lab 01/14/12 1501  INR 0.93  PROTIME --    CBC:  Lab 01/14/12 1505  WBC 27.3*  NEUTROABS 24.0*  HGB 12.1*  HCT 37.7*  MCV 94.5  PLT 393   Cardiac Enzymes:  Lab 01/14/12 1501  CKTOTAL 81  CKMB 3.0  CKMBINDEX --  TROPONINI <0.30    Imaging results:  Ct Head Wo  Contrast  01/14/2012  *RADIOLOGY REPORT*  Clinical Data: Fall, confusion, incontinence  CT HEAD WITHOUT CONTRAST  Technique:  Contiguous axial images were obtained from the base of the skull through the vertex without contrast.  Comparison: 02/13/2011  Findings: No evidence of parenchymal hemorrhage or extra-axial fluid collection. No mass lesion, mass effect, or midline shift.  No CT evidence of acute infarction.  Extensive small vessel ischemic changes.  Intracranial atherosclerosis.  Global cortical atrophy.  Secondary ventricular prominence, unchanged from prior studies.  Mucous retention cyst in the bilateral maxillary sinuses.  The visualized paranasal sinuses are otherwise clear.  Mild partial opacification of the right mastoid air cells, chronic.  No evidence of calvarial fracture.  IMPRESSION: No evidence of acute intracranial abnormality.  Atrophy with stable ventricular prominence.  Extensive small vessel ischemic changes and intracranial atherosclerosis.  Original Report Authenticated By: Charline Bills, M.D.   Dg Chest Port 1 View  01/14/2012  *RADIOLOGY REPORT*  Clinical Data: Difficulty breathing.  PORTABLE CHEST - 1 VIEW  Comparison: 10/24/2010  Findings: Patchy bilateral lower lobe airspace opacities are again noted.  These have slightly increased since prior study.  Cannot exclude acute infiltrates.  Underlying COPD.  Heart is normal size. No effusions or acute bony abnormality.  IMPRESSION: Patchy bilateral lower lobe opacities, right greater than left. Cannot exclude pneumonia.  COPD.  Original Report Authenticated By: Cyndie Chime, M.D.    Assessment & Plan: Principal Problem:  *Generalized weakness / Fall  Unclear etiology.  Relatively asymptomatic despite chest radiograph findings.  Will treat for mild community acquired pneumonia and COPD exacerbation given underlying comorbidities.  Obtain PT/OT evaluations.  Check orthostatics. Active Problems:  HYPERTENSION  Hold  Lisinopril given ARF.  CAP (community acquired pneumonia)  Relatively asymptomatic.  Treat empirically with Levaquin.  COPD exacerbation  Treat empirically with Solumedrol, antibiotics, bronchodilators.  Continue Spiriva.  Hyperkalemia / ARF (acute renal failure) / Stage 3 CKD  ? Pre-renal (No complaints of nausea, vomiting or diarrhea)  ? Post-renal (Denies weak urine flow, but on medicines for BPH)  Likely CKD.  Creatinine 1.6-2.49 on previous tests.  Hold lisinopril, hydrate and monitor.  Give kayexalate for hyperkalemia.  Prophylaxis: PAS hoses.  Time Spent On Admission: 1 hour.  Drako Maese 01/14/2012, 6:26 PM Pager [phone]

## 2012-01-14 NOTE — ED Notes (Addendum)
Pt in c/o increased falls and frequent urination over last two days, decreased ability to ambulate as normal, denies pain, family states mild increase in confusion, pt normally has some confusion, A &O x4 at this time, pt with increased incontinence

## 2012-01-15 DIAGNOSIS — R7989 Other specified abnormal findings of blood chemistry: Secondary | ICD-10-CM

## 2012-01-15 DIAGNOSIS — R739 Hyperglycemia, unspecified: Secondary | ICD-10-CM | POA: Diagnosis present

## 2012-01-15 DIAGNOSIS — E875 Hyperkalemia: Secondary | ICD-10-CM

## 2012-01-15 DIAGNOSIS — J438 Other emphysema: Secondary | ICD-10-CM

## 2012-01-15 DIAGNOSIS — D696 Thrombocytopenia, unspecified: Secondary | ICD-10-CM

## 2012-01-15 DIAGNOSIS — D72829 Elevated white blood cell count, unspecified: Secondary | ICD-10-CM | POA: Diagnosis present

## 2012-01-15 LAB — BASIC METABOLIC PANEL
BUN: 34 mg/dL — ABNORMAL HIGH (ref 6–23)
CO2: 22 mEq/L (ref 19–32)
CO2: 22 mEq/L (ref 19–32)
Calcium: 8 mg/dL — ABNORMAL LOW (ref 8.4–10.5)
Calcium: 8.1 mg/dL — ABNORMAL LOW (ref 8.4–10.5)
Chloride: 100 mEq/L (ref 96–112)
Creatinine, Ser: 1.94 mg/dL — ABNORMAL HIGH (ref 0.50–1.35)
Creatinine, Ser: 1.8 mg/dL — ABNORMAL HIGH (ref 0.50–1.35)
GFR calc Af Amer: 40 mL/min — ABNORMAL LOW (ref >90)
GFR calc non Af Amer: 34 mL/min — ABNORMAL LOW (ref >90)
Glucose, Bld: 226 mg/dL — ABNORMAL HIGH (ref 70–99)
Glucose, Bld: 474 mg/dL — ABNORMAL HIGH (ref 70–99)
Potassium: 5.2 mEq/L — ABNORMAL HIGH (ref 3.5–5.1)
Sodium: 133 mEq/L — ABNORMAL LOW (ref 135–145)

## 2012-01-15 LAB — CBC
MCH: 29.7 pg (ref 26.0–34.0)
MCV: 93.9 fL (ref 78.0–100.0)
Platelets: 332 10*3/uL (ref 150–400)
RDW: 15.7 % — ABNORMAL HIGH (ref 11.5–15.5)

## 2012-01-15 MED ORDER — ALBUTEROL SULFATE (5 MG/ML) 0.5% IN NEBU: 2.5000 mg | INHALATION_SOLUTION | RESPIRATORY_TRACT | Status: DC | PRN

## 2012-01-15 MED ORDER — ALBUTEROL SULFATE HFA 108 (90 BASE) MCG/ACT IN AERS
2.0000 | INHALATION_SPRAY | Freq: Two times a day (BID) | RESPIRATORY_TRACT | Status: DC
  Administered 2012-01-16 – 2012-01-18 (×5): 2 via RESPIRATORY_TRACT
  Filled 2012-01-15: qty 6.7

## 2012-01-15 MED ORDER — METHYLPREDNISOLONE SODIUM SUCC 40 MG IJ SOLR
40.0000 mg | Freq: Two times a day (BID) | INTRAMUSCULAR | Status: DC
  Administered 2012-01-15 – 2012-01-16 (×2): 40 mg via INTRAVENOUS
  Filled 2012-01-15 (×3): qty 1

## 2012-01-15 MED ORDER — INSULIN ASPART 100 UNIT/ML ~~LOC~~ SOLN
0.0000 [IU] | Freq: Three times a day (TID) | SUBCUTANEOUS | Status: DC
  Administered 2012-01-15: 8 [IU] via SUBCUTANEOUS
  Administered 2012-01-16 (×2): 3 [IU] via SUBCUTANEOUS
  Administered 2012-01-16: 2 [IU] via SUBCUTANEOUS

## 2012-01-15 NOTE — Evaluation (Signed)
Physical Therapy Evaluation Patient Details Name: Walter [name] MRN: [id number] DOB: 1930-05-08 Today's Date: 01/15/2012 Time: 4098-1191 PT Time Calculation (min): 35 min  PT Assessment / Plan / Recommendation Clinical Impression  Pt is 78 yo male with CAP and recent fall OOB who presents with LE weakness, balance impairment, and gait abnormality combining for increased fall risk and decreased safety. Will benfit from acute PT to begin addressing these issues as well as HHPT upon d/c.  Pt and RN advised to use RW for all OOB mobility.    PT Assessment  Patient needs continued PT services    Follow Up Recommendations  Home health PT;Supervision for mobility/OOB    Equipment Recommendations  None recommended by PT    Frequency Min 3X/week    Precautions / Restrictions Precautions Precautions: Fall Restrictions Weight Bearing Restrictions: No   Pertinent Vitals/Pain No pain Before ambulation BP 138/69  O2 sats 96% After ambulation   BP 142/61    O2 sats 97% DOE  2/4 with ambulation      Mobility  Bed Mobility Bed Mobility: Not assessed Transfers Transfers: Sit to Stand;Stand to Sit Sit to Stand: 5: Supervision;With upper extremity assist;From chair/3-in-1 Stand to Sit: 5: Supervision;With armrests;To chair/3-in-1 Details for Transfer Assistance: Pt sit/ stands saefly from chair, no impairment or dizziness noted Ambulation/Gait Ambulation/Gait Assistance: 4: Min assist Ambulation Distance (Feet): 150 Feet Assistive device: Rolling walker Ambulation/Gait Assistance Details: pt pushed pole first 42' (did not use AD PTA) with increasing reliance on pole with increasing distance and decreased safety.  Pt given RW for 2nd half of walk with increased safety and step length,  Pt needed vc's to monitor pace as he tended to rush and not realize that his trunk was getting ahead of his feet. Gait Pattern: Step-to pattern;Decreased step length - left;Trunk flexed (left foot  shuffle) Gait velocity: decreased General Gait Details: Pt able to lift left foot higher during stepping but needs vc's to do it.  Overall decreased safety with ambulation and recommendation that pt use RW for all gait until advised by HHPT to transition back to no AD.  Stairs: No Wheelchair Mobility Wheelchair Mobility: No    Exercises General Exercises - Lower Extremity Hip Flexion/Marching: AROM;Strengthening;Both;10 reps;Seated   PT Goals Acute Rehab PT Goals PT Goal Formulation: With patient Time For Goal Achievement: 01/29/12 Potential to Achieve Goals: Good Pt will go Supine/Side to Sit: with modified independence;with HOB 0 degrees PT Goal: Supine/Side to Sit - Progress: Goal set today Pt will go Sit to Supine/Side: with modified independence;with HOB 0 degrees PT Goal: Sit to Supine/Side - Progress: Goal set today Pt will go Sit to Stand: with modified independence PT Goal: Sit to Stand - Progress: Goal set today Pt will go Stand to Sit: with modified independence PT Goal: Stand to Sit - Progress: Goal set today Pt will Ambulate: >150 feet;with modified independence;with least restrictive assistive device;with gait velocity >(comment) ft/second (>1.67ft/sec) PT Goal: Ambulate - Progress: Goal set today Pt will Perform Home Exercise Program: with supervision, verbal cues required/provided PT Goal: Perform Home Exercise Program - Progress: Goal set today  Visit Information  Last PT Received On: 01/15/12 Assistance Needed: +1    Subjective Data  Subjective: Pt wants to make sure he gets home by saturday for "the derby".  Pt states several times that he did not fall but rather had a bad dream and slipped out of bed, wedging himself between the bed and desk so that he was unable  to get up.  He denies falls at home but does admit that his balance has been off for about a month. Patient Stated Goal: return home   Prior Functioning  Home Living Lives With: Spouse Available Help  at Discharge: Family Type of Home: House Home Access: Level entry Home Layout: One level Bathroom Shower/Tub: Engineer, manufacturing systems: Standard Home Adaptive Equipment: Walker - rolling;Straight cane;Shower chair with back Prior Function Level of Independence: Independent Able to Take Stairs?: Yes Driving: Yes Vocation: Retired Musician: No difficulties    Cognition  Overall Cognitive Status: Appears within functional limits for tasks assessed/performed Arousal/Alertness: Awake/alert Orientation Level: Oriented X4 / Intact Behavior During Session: WFL for tasks performed Cognition - Other Comments: Pt did repeat himself several times and ask the same questions multiple times.  I also question the reliability of his history and he seems to be lacking in safety awareness/ self limitations.    Extremity/Trunk Assessment Right Upper Extremity Assessment RUE ROM/Strength/Tone: WFL for tasks assessed RUE Sensation: WFL - Light Touch;WFL - Proprioception RUE Coordination: WFL - gross/fine motor Left Upper Extremity Assessment LUE ROM/Strength/Tone: WFL for tasks assessed LUE Sensation: WFL - Light Touch;WFL - Proprioception LUE Coordination: WFL - gross/fine motor Right Lower Extremity Assessment RLE ROM/Strength/Tone: Within functional levels RLE Sensation: WFL - Proprioception;History of peripheral neuropathy RLE Coordination: WFL - gross/fine motor Left Lower Extremity Assessment LLE ROM/Strength/Tone: Deficits LLE ROM/Strength/Tone Deficits: previous h/o THA: hip flex 4-/5, hip abd 4/5, knee ext 4+/5, knee flex 4+/5.  Functional weakness noted with ambulation as pt shuffles left foot. LLE Sensation: WFL - Proprioception;History of peripheral neuropathy LLE Coordination: WFL - gross/fine motor Trunk Assessment Trunk Assessment: Kyphotic   Balance Balance Balance Assessed: Yes Dynamic Standing Balance Dynamic Standing - Balance Support: Right upper  extremity supported;During functional activity Dynamic Standing - Level of Assistance: 4: Min assist  End of Session PT - End of Session Equipment Utilized During Treatment: Gait belt Activity Tolerance: Patient limited by fatigue Patient left: in chair;with call bell/phone within reach Nurse Communication: Mobility status   Tevyn Codd, [place] 01/15/2012, 9:28 AM  Lyanne Co, PT  Acute Rehab Services  [phone]

## 2012-01-15 NOTE — Progress Notes (Signed)
Admitted to room 1320 from ED with PNA. VSS. Patient oriented to room and call bell use. Will need reinforcement. Patient is forgetful at times. Larey Seat this am. Bed alarm on. Orthostatic vitals had been done in ED earlier today. Will recheck in am to compare. Hungry and wants "something to eat". NT to go to cafeteria to get "late plate".

## 2012-01-15 NOTE — Progress Notes (Signed)
Subjective:  Patient states that he feels a little better today.  Still not quite back to baseline per patient.  He denies any fever, chills, nausea, vomiting, chest pain.  Objective: Filed Vitals:   01/15/12 0900 01/15/12 1212 01/15/12 1405 01/15/12 1508  BP: 142/61  174/56   Pulse:   71   Temp:   98.1 F (36.7 C)   TempSrc:   Oral   Resp:   18   Height:      Weight:      SpO2: 97% 96% 96% 99%   Weight change:   Intake/Output Summary (Last 24 hours) at 01/15/12 1706 Last data filed at 01/15/12 1300  Gross per 24 hour  Intake   2251 ml  Output    500 ml  Net   1751 ml    General: Alert, awake, oriented x3, in no acute distress.  HEENT: No bruits, no goiter.  Heart: Regular rate and rhythm, without murmurs, rubs, gallops.  Lungs: Clear to auscultation, bilateral air movement. Decreased breath sounds at bases.  Prolonged exp. Phase. Abdomen: Soft, nontender, nondistended, positive bowel sounds.  Neuro: Grossly intact, nonfocal.   Lab Results:  [facility] 01/15/12 1229 01/15/12 0357  NA 133* 136  K 5.2* 6.2*  CL 100 106  CO2 22 22  GLUCOSE 474* 226*  BUN 34* 35*  CREATININE 1.80* 1.94*  CALCIUM 8.1* 8.0*  MG -- --  PHOS -- --    Basename 01/14/12 1530  AST 18  ALT 23  ALKPHOS 66  BILITOT 0.3  PROT 6.6  ALBUMIN 3.4*   No results found for this basename: LIPASE:2,AMYLASE:2 in the last 72 hours  Basename 01/15/12 0357 01/14/12 1505  WBC 18.9* 27.3*  NEUTROABS -- 24.0*  HGB 10.7* 12.1*  HCT 33.8* 37.7*  MCV 93.9 94.5  PLT 332 393    Basename 01/14/12 1501  CKTOTAL 81  CKMB 3.0  CKMBINDEX --  TROPONINI <0.30   No components found with this basename: POCBNP:3 No results found for this basename: DDIMER:2 in the last 72 hours No results found for this basename: HGBA1C:2 in the last 72 hours No results found for this basename: CHOL:2,HDL:2,LDLCALC:2,TRIG:2,CHOLHDL:2,LDLDIRECT:2 in the last 72 hours  Basename 01/15/12 0357  TSH 0.876  T4TOTAL --    T3FREE --  THYROIDAB --   No results found for this basename: VITAMINB12:2,FOLATE:2,FERRITIN:2,TIBC:2,IRON:2,RETICCTPCT:2 in the last 72 hours  Micro Results: No results found for this or any previous visit (from the past 240 hour(s)).  Studies/Results: Ct Head Wo Contrast  01/14/2012  *RADIOLOGY REPORT*  Clinical Data: Fall, confusion, incontinence  CT HEAD WITHOUT CONTRAST  Technique:  Contiguous axial images were obtained from the base of the skull through the vertex without contrast.  Comparison: 02/13/2011  Findings: No evidence of parenchymal hemorrhage or extra-axial fluid collection. No mass lesion, mass effect, or midline shift.  No CT evidence of acute infarction.  Extensive small vessel ischemic changes.  Intracranial atherosclerosis.  Global cortical atrophy.  Secondary ventricular prominence, unchanged from prior studies.  Mucous retention cyst in the bilateral maxillary sinuses.  The visualized paranasal sinuses are otherwise clear.  Mild partial opacification of the right mastoid air cells, chronic.  No evidence of calvarial fracture.  IMPRESSION: No evidence of acute intracranial abnormality.  Atrophy with stable ventricular prominence.  Extensive small vessel ischemic changes and intracranial atherosclerosis.  Original Report Authenticated By: Charline Bills, M.D.   Dg Chest Port 1 View  01/14/2012  *RADIOLOGY REPORT*  Clinical Data: Difficulty breathing.  PORTABLE CHEST - 1 VIEW  Comparison: 10/24/2010  Findings: Patchy bilateral lower lobe airspace opacities are again noted.  These have slightly increased since prior study.  Cannot exclude acute infiltrates.  Underlying COPD.  Heart is normal size. No effusions or acute bony abnormality.  IMPRESSION: Patchy bilateral lower lobe opacities, right greater than left. Cannot exclude pneumonia.  COPD.  Original Report Authenticated By: Cyndie Chime, M.D.    Medications: I have reviewed the patient's current  medications.   Patient Active Hospital Problem List: Generalized weakness (01/14/2012) At this point most likely due to CAP/COPD exacerbation.  HYPERTENSION (02/11/2008) Not well controlled currently but have seen his blood pressure fluctuate from 174/56 to 135/65.  Will monitor today and consider changing his regimen if bp's consistently stay elevated.  Fall (01/14/2012) Will place on fall precautions.  Patient feels like his strength is back to baseline.  CAP (community acquired pneumonia) (01/14/2012) Pt is currently on levaquin.  Will plan on continuing this regimen currently.  Patient is afebrile and his WBC count is trending down.  COPD exacerbation (01/14/2012) Likely causing #1.  Will plan on continuing this regimen.  Hyperkalemia (01/14/2012) Currently trending down.  At this point will place on IVF's and continue to monitor.  Should improve with insulin administration and albuterol treatment.  Yesterday patient received kayexalate.  CKD (chronic kidney disease) stage 3, GFR 30-59 ml/min (01/14/2012) Will continue to monitor.  Patient seems to be at baseline.  ARF (acute renal failure) (01/14/2012) Likely prerenal.  Will give IVF's today and reassess tomorrow in the am.  Hyperglycemia:  Will check hgb A1c at this point.  Place on insulin sliding scale and monitor cbg readings.  Likely due to recent steroid regimen but will obtain hgba1c to see if this is chronic.     LOS: 1 day   Penny Pia M.D.  [facility] 01/15/2012, 5:06 PM

## 2012-01-15 NOTE — Progress Notes (Signed)
Patient K 6.2 this am. Kayexalate given this am after receiving from pharmacy (had been sent to ED but not given there before transferred to unit). Not given til right before lab draws. Paged NP. Order to repeat lab at 1200pm.

## 2012-01-15 NOTE — Progress Notes (Signed)
Inpatient Diabetes Program Recommendations  AACE/ADA: New Consensus Statement on Inpatient Glycemic Control (2009)  Target Ranges:  Prepandial:   less than 140 mg/dL      Peak postprandial:   less than 180 mg/dL (1-2 hours)      Critically ill patients:  140 - 180 mg/dL   Reason for Visit: Hyperglycemia  Results for Walter [name], Walter [name] (MRN [id number]) as of 01/15/2012 16:41  Ref. Range 01/15/2012 03:57 01/15/2012 12:29 01/15/2012 12:56  Glucose Latest Range: 70-99 mg/dL 409 (H) 811 (H)     Inpatient Diabetes Program Recommendations Correction (SSI): Add Novolog sensitive tidwc HgbA1C: Check HgbA1C to assess glycemic control - Last HgbA1c was 6.2% in 10/25/2010  Note: Will follow.

## 2012-01-15 NOTE — Evaluation (Signed)
Occupational Therapy Evaluation Patient Details Name: Walter [name] MRN: [id number] DOB: May 23, 1930 Today's Date: 01/15/2012 Time: 1308-6578 OT Time Calculation (min): 35 min  OT Assessment / Plan / Recommendation Clinical Impression  Pt admitted with HCAP after falling off of bed at home.  Pt with deficits related to balance and decreased endurance impeding ADL.  Will follow acutely.  Do not anticipate OT needs upon d/c.    OT Assessment  Patient needs continued OT Services    Follow Up Recommendations  Supervision/Assistance - 24 hour    Equipment Recommendations  None recommended by OT    Frequency Min 2X/week    Precautions / Restrictions Precautions Precautions: Fall Restrictions Weight Bearing Restrictions: No        ADL  Eating/Feeding: Simulated;Independent Where Assessed - Eating/Feeding: Chair Grooming: Performed;Wash/dry hands;Brushing hair;Denture care;Supervision/safety Where Assessed - Grooming: Standing at sink Upper Body Bathing: Simulated;Set up Where Assessed - Upper Body Bathing: Sitting, chair Lower Body Bathing: Simulated;Minimal assistance Where Assessed - Lower Body Bathing: Sitting, chair;Sit to stand from chair Upper Body Dressing: Performed;Set up Where Assessed - Upper Body Dressing: Sitting, chair Lower Body Dressing: Performed;Minimal assistance;Other (comment) (becomes fatigued easily with bending over) Where Assessed - Lower Body Dressing: Sitting, chair;Sit to stand from chair Toilet Transfer: Performed;Minimal assistance Toilet Transfer Method: Proofreader: Regular height toilet Toileting - Clothing Manipulation: Simulated;Supervision/safety Where Assessed - Toileting Clothing Manipulation: Standing Equipment Used: Gait belt Ambulation Related to ADLs: Hand held assist to ambulate to bathroom. Pt with some unsteadiness. ADL Comments: Pt states he fatigues easily at baseline especially with prolonged standing  and bending down to his feet to donn socks.  He does not report SOB at baseline.  Pt is aware of availability of sock aide and where he may purchase.  Educated and provided handout on energy conservation.    OT Goals Acute Rehab OT Goals OT Goal Formulation: With patient Time For Goal Achievement: 01/22/12 Potential to Achieve Goals: Good ADL Goals Pt Will Perform Grooming: with modified independence;Standing at sink (3 activities) ADL Goal: Grooming - Progress: Goal set today Pt Will Perform Lower Body Dressing: with modified independence;Sitting, bed;with adaptive equipment (with sock aide) ADL Goal: Lower Body Dressing - Progress: Goal set today Pt Will Transfer to Toilet: with modified independence;Ambulation;Regular height toilet ADL Goal: Toilet Transfer - Progress: Goal set today  Visit Information  Last OT Received On: 01/15/12 Assistance Needed: +1    Subjective Data  Subjective: "I had no idea I had pneumonia." Patient Stated Goal: Return to PLOF.   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: [employer] Dominant Hand: Right    Cognition  Overall Cognitive Status: Appears within functional limits for tasks assessed/performed Arousal/Alertness: Awake/alert Orientation Level: Oriented X4 / Intact Behavior During Session: WFL for tasks performed Cognition - Other Comments:  (Pt with mild memory deficits and decreased awareness of defi)    Extremity/Trunk Assessment Right Upper Extremity Assessment RUE  ROM/Strength/Tone: WFL for tasks assessed RUE Sensation: WFL - Light Touch;WFL - Proprioception RUE Coordination: WFL - gross/fine motor Left Upper Extremity Assessment LUE ROM/Strength/Tone: WFL for tasks assessed LUE Sensation: WFL - Light Touch;WFL -  Proprioception LUE Coordination: WFL - gross/fine motor Trunk Assessment Trunk Assessment: Kyphotic   Mobility Bed Mobility Bed Mobility: Not assessed Transfers Transfers: Sit to Stand;Stand to Sit Sit to Stand: 5: Supervision;With upper extremity assist;From chair/3-in-1 Stand to Sit: 5: Supervision;With armrests;To chair/3-in-1 Details for Transfer Assistance: Pt sit/ stands saefly from chair, no impairment or dizziness noted         End of Session OT - End of Session Activity Tolerance: Patient tolerated treatment well Patient left: in chair;with call bell/phone within reach;with family/visitor present   Evern Bio 01/15/2012, 10:37 AM

## 2012-01-16 DIAGNOSIS — D696 Thrombocytopenia, unspecified: Secondary | ICD-10-CM

## 2012-01-16 DIAGNOSIS — J438 Other emphysema: Secondary | ICD-10-CM

## 2012-01-16 DIAGNOSIS — R7989 Other specified abnormal findings of blood chemistry: Secondary | ICD-10-CM

## 2012-01-16 DIAGNOSIS — E875 Hyperkalemia: Secondary | ICD-10-CM

## 2012-01-16 LAB — BASIC METABOLIC PANEL
BUN: 36 mg/dL — ABNORMAL HIGH (ref 6–23)
Chloride: 106 mEq/L (ref 96–112)
Creatinine, Ser: 1.91 mg/dL — ABNORMAL HIGH (ref 0.50–1.35)
GFR calc Af Amer: 37 mL/min — ABNORMAL LOW (ref >90)
GFR calc non Af Amer: 32 mL/min — ABNORMAL LOW (ref >90)

## 2012-01-16 LAB — GLUCOSE, CAPILLARY: Glucose-Capillary: 130 mg/dL — ABNORMAL HIGH (ref 70–99)

## 2012-01-16 LAB — CBC
HCT: 31 % — ABNORMAL LOW (ref 39.0–52.0)
MCH: 30.1 pg (ref 26.0–34.0)
MCH: 29.8 pg (ref 26.0–34.0)
MCHC: 32.6 g/dL (ref 30.0–36.0)
MCHC: 32.1 g/dL (ref 30.0–36.0)
Platelets: 405 10*3/uL — ABNORMAL HIGH (ref 150–400)
RBC: 3.63 MIL/uL — ABNORMAL LOW (ref 4.22–5.81)
RDW: 15.5 % (ref 11.5–15.5)
RDW: 15.7 % — ABNORMAL HIGH (ref 11.5–15.5)

## 2012-01-16 LAB — URINE CULTURE: Culture  Setup Time: 201305010459

## 2012-01-16 LAB — HEMOGLOBIN A1C: Mean Plasma Glucose: 123 mg/dL — ABNORMAL HIGH (ref <117)

## 2012-01-16 MED ORDER — PREDNISONE 20 MG PO TABS
40.0000 mg | ORAL_TABLET | Freq: Every day | ORAL | Status: DC
  Filled 2012-01-16: qty 2

## 2012-01-16 NOTE — Progress Notes (Signed)
Subjective: Patient states that he feels fine.  Denies any shortness of breath.  Has no acute complaints.  Objective: Filed Vitals:   01/16/12 0500 01/16/12 0815 01/16/12 1450 01/16/12 1500  BP: 132/61  161/65 150/70  Pulse: 75  73   Temp: 98 F (36.7 C)  98 F (36.7 C)   TempSrc: Oral  Oral   Resp: 18  18   Height:      Weight:      SpO2: 98% 93% 98%    Weight change:   Intake/Output Summary (Last 24 hours) at 01/16/12 1710 Last data filed at 01/16/12 1451  Gross per 24 hour  Intake   1344 ml  Output   1775 ml  Net   -431 ml    General: Alert, awake, oriented x3, in no acute distress.  HEENT: No bruits, no goiter.  Heart: Regular rate and rhythm, without murmurs, rubs, gallops.  Lungs: CTA BL with prolonged expiratory phase, bilateral air movement.  Abdomen: Soft, nontender, nondistended, positive bowel sounds.  Neuro: Grossly intact, nonfocal.   Lab Results:  Basename 01/16/12 0350 01/15/12 1229  NA 136 133*  K 4.8 5.2*  CL 106 100  CO2 20 22  GLUCOSE 169* 474*  BUN 36* 34*  CREATININE 1.91* 1.80*  CALCIUM 8.0* 8.1*  MG -- --  PHOS -- --    Basename 01/14/12 1530  AST 18  ALT 23  ALKPHOS 66  BILITOT 0.3  PROT 6.6  ALBUMIN 3.4*   No results found for this basename: LIPASE:2,AMYLASE:2 in the last 72 hours  Basename 01/16/12 1139 01/16/12 0350 01/14/12 1505  WBC 27.4* 22.3* --  NEUTROABS -- -- 24.0*  HGB 10.8* 10.1* --  HCT 33.6* 31.0* --  MCV 92.6 92.3 --  PLT 405* 368 --    Basename 01/14/12 1501  CKTOTAL 81  CKMB 3.0  CKMBINDEX --  TROPONINI <0.30   No components found with this basename: POCBNP:3 No results found for this basename: DDIMER:2 in the last 72 hours  Basename 01/16/12 0350  HGBA1C 5.9*   No results found for this basename: CHOL:2,HDL:2,LDLCALC:2,TRIG:2,CHOLHDL:2,LDLDIRECT:2 in the last 72 hours  Basename 01/15/12 0357  TSH 0.876  T4TOTAL --  T3FREE --  THYROIDAB --   No results found for this basename:  VITAMINB12:2,FOLATE:2,FERRITIN:2,TIBC:2,IRON:2,RETICCTPCT:2 in the last 72 hours  Micro Results: Recent Results (from the past 240 hour(s))  URINE CULTURE     Status: Normal   Collection Time   01/14/12  2:49 PM      Component Value Range Status Comment   Specimen Description URINE, CLEAN CATCH   Final    Special Requests NONE   Final    Culture  Setup Time [id number]   Final    Colony Count 40,000 COLONIES/ML   Final    Culture     Final    Value: Multiple bacterial morphotypes present, none predominant. Suggest appropriate recollection if clinically indicated.   Report Status 01/16/2012 FINAL   Final   CULTURE, BLOOD (ROUTINE X 2)     Status: Normal (Preliminary result)   Collection Time   01/14/12  4:50 PM      Component Value Range Status Comment   Specimen Description BLOOD RIGHT ARM   Final    Special Requests BOTTLES DRAWN AEROBIC AND ANAEROBIC   Final    Culture  Setup Time [id number]   Final    Culture     Final    Value:  BLOOD CULTURE RECEIVED NO GROWTH TO DATE CULTURE WILL BE HELD FOR 5 DAYS BEFORE ISSUING A FINAL NEGATIVE REPORT   Report Status PENDING   Incomplete   CULTURE, BLOOD (ROUTINE X 2)     Status: Normal (Preliminary result)   Collection Time   01/14/12  5:05 PM      Component Value Range Status Comment   Specimen Description BLOOD RIGHT HAND   Final    Special Requests BOTTLES DRAWN AEROBIC AND ANAEROBIC   Final    Culture  Setup Time [id number]   Final    Culture     Final    Value:        BLOOD CULTURE RECEIVED NO GROWTH TO DATE CULTURE WILL BE HELD FOR 5 DAYS BEFORE ISSUING A FINAL NEGATIVE REPORT   Report Status PENDING   Incomplete     Studies/Results: No results found.  Medications: I have reviewed the patient's current medications.   Patient Active Hospital Problem List: Generalized weakness (01/14/2012) At this point most likely due to CAP/COPD exacerbation.   HYPERTENSION (02/11/2008) Not well controlled currently but have  seen his blood pressure fluctuate from 174/56 to 135/65. Will monitor today and consider changing his regimen if bp's consistently stay elevated.   Fall (01/14/2012) Will place on fall precautions. Patient feels like his strength is back to baseline.   CAP (community acquired pneumonia) (01/14/2012) Pt is currently on levaquin. Will plan on continuing this regimen currently. Patient is afebrile and clinically is improved on levaquin.  Despite elevation in WBC count.  COPD exacerbation (01/14/2012) Likely causing #1. Will plan on continuing this regimen. Given that patient feels like he is closer to baseline and he does not have wheezes on physical exam will discontinue the steroids as I feel that this may be causing his leukocytosis and not offering much benefit.  Hyperkalemia (01/14/2012) Currently trending down. At this point will place on IVF's and continue to monitor. Should improve with insulin administration and albuterol treatment.   CKD (chronic kidney disease) stage 3, GFR 30-59 ml/min (01/14/2012) Will continue to monitor. Patient seems to be at baseline.   ARF (acute renal failure) (01/14/2012) Likely prerenal. Will give IVF's today and reassess tomorrow in the am.   Hyperglycemia: Will check hgb A1c at this point. Place on insulin sliding scale and monitor cbg readings. Likely due to recent steroid regimen.  His last HgbA1c was 5.9.    I have decided to d/c steroid and monitor patient's cbg's.  He will most likely not require much insulin correction and I suspect that we will have to recommend lifestyle modification options once patient is ready for discharge.   LOS: 2 days   Penny Pia M.D.  [facility] 01/16/2012, 5:10 PM

## 2012-01-17 ENCOUNTER — Inpatient Hospital Stay (HOSPITAL_COMMUNITY): Payer: Medicare Other

## 2012-01-17 DIAGNOSIS — E875 Hyperkalemia: Secondary | ICD-10-CM

## 2012-01-17 DIAGNOSIS — J438 Other emphysema: Secondary | ICD-10-CM

## 2012-01-17 DIAGNOSIS — D696 Thrombocytopenia, unspecified: Secondary | ICD-10-CM

## 2012-01-17 DIAGNOSIS — R7989 Other specified abnormal findings of blood chemistry: Secondary | ICD-10-CM

## 2012-01-17 LAB — CBC
HCT: 31 % — ABNORMAL LOW (ref 39.0–52.0)
HCT: 36.2 % — ABNORMAL LOW (ref 39.0–52.0)
MCH: 29.8 pg (ref 26.0–34.0)
MCH: 30.3 pg (ref 26.0–34.0)
MCHC: 32.3 g/dL (ref 30.0–36.0)
MCV: 92.3 fL (ref 78.0–100.0)
MCV: 93.1 fL (ref 78.0–100.0)
Platelets: 394 10*3/uL (ref 150–400)
RDW: 15.7 % — ABNORMAL HIGH (ref 11.5–15.5)
RDW: 15.7 % — ABNORMAL HIGH (ref 11.5–15.5)

## 2012-01-17 LAB — BASIC METABOLIC PANEL
BUN: 45 mg/dL — ABNORMAL HIGH (ref 6–23)
Calcium: 7.9 mg/dL — ABNORMAL LOW (ref 8.4–10.5)
Creatinine, Ser: 2.14 mg/dL — ABNORMAL HIGH (ref 0.50–1.35)
GFR calc Af Amer: 32 mL/min — ABNORMAL LOW (ref >90)
GFR calc non Af Amer: 28 mL/min — ABNORMAL LOW (ref >90)

## 2012-01-17 LAB — GLUCOSE, CAPILLARY: Glucose-Capillary: 102 mg/dL — ABNORMAL HIGH (ref 70–99)

## 2012-01-17 LAB — RAPID STREP SCREEN (MED CTR MEBANE ONLY): Streptococcus, Group A Screen (Direct): NEGATIVE

## 2012-01-17 MED ORDER — DEXTROSE 5 % IV SOLN
250.0000 mg | INTRAVENOUS | Status: DC
  Administered 2012-01-17: 250 mg via INTRAVENOUS
  Filled 2012-01-17 (×2): qty 250

## 2012-01-17 MED ORDER — HYDRALAZINE HCL 20 MG/ML IJ SOLN: 5.0000 mg | Freq: Four times a day (QID) | INTRAMUSCULAR | Status: DC | PRN

## 2012-01-17 MED ORDER — LISINOPRIL 40 MG PO TABS
40.0000 mg | ORAL_TABLET | Freq: Every day | ORAL | Status: DC
  Filled 2012-01-17: qty 1

## 2012-01-17 MED ORDER — CHLORTHALIDONE 25 MG PO TABS
25.0000 mg | ORAL_TABLET | Freq: Every day | ORAL | Status: DC
  Administered 2012-01-17 – 2012-01-18 (×2): 25 mg via ORAL
  Filled 2012-01-17 (×2): qty 1

## 2012-01-17 MED ORDER — DEXTROSE 5 % IV SOLN
1.0000 g | INTRAVENOUS | Status: DC
  Administered 2012-01-17: 1 g via INTRAVENOUS
  Filled 2012-01-17 (×2): qty 10

## 2012-01-17 NOTE — Progress Notes (Signed)
Subjective: Patient states that he still feels weak.  Denies any difficulty breathing.  No acute issues overnight.  He denies any fever or chills.  Objective: Filed Vitals:   01/16/12 2120 01/17/12 0358 01/17/12 0954 01/17/12 1324  BP: 149/66 152/71  187/73  Pulse: 72 62  80  Temp: 98 F (36.7 C) 97.4 F (36.3 C)  98.1 F (36.7 C)  TempSrc: Oral Oral  Oral  Resp: 18 18  18   Height:      Weight:      SpO2: 97% 95% 98% 97%   Weight change:   Intake/Output Summary (Last 24 hours) at 01/17/12 1449 Last data filed at 01/17/12 0919  Gross per 24 hour  Intake    840 ml  Output    950 ml  Net   -110 ml    General: Alert, awake, oriented x3, in no acute distress.  HEENT: No bruits, no goiter.  Heart: Regular rate and rhythm, without murmurs, rubs, gallops.  Lungs: Prolonged expiratory phase no wheezes, bilateral air movement.  Abdomen: Soft, nontender, nondistended, positive bowel sounds.  Neuro: Grossly intact, nonfocal.   Lab Results:  [facility] 01/17/12 0346 01/16/12 0350  NA 137 136  K 5.0 4.8  CL 107 106  CO2 20 20  GLUCOSE 126* 169*  BUN 45* 36*  CREATININE 2.14* 1.91*  CALCIUM 7.9* 8.0*  MG -- --  PHOS -- --    Basename 01/14/12 1530  AST 18  ALT 23  ALKPHOS 66  BILITOT 0.3  PROT 6.6  ALBUMIN 3.4*   No results found for this basename: LIPASE:2,AMYLASE:2 in the last 72 hours  Basename 01/17/12 1150 01/17/12 0346 01/14/12 1505  WBC 24.8* 22.0* --  NEUTROABS -- -- 24.0*  HGB 11.8* 10.0* --  HCT 36.2* 31.0* --  MCV 93.1 92.3 --  PLT 394 358 --    Basename 01/14/12 1501  CKTOTAL 81  CKMB 3.0  CKMBINDEX --  TROPONINI <0.30   No components found with this basename: POCBNP:3 No results found for this basename: DDIMER:2 in the last 72 hours  Basename 01/16/12 0350  HGBA1C 5.9*   No results found for this basename: CHOL:2,HDL:2,LDLCALC:2,TRIG:2,CHOLHDL:2,LDLDIRECT:2 in the last 72 hours  Basename 01/15/12 0357  TSH 0.876  T4TOTAL --  T3FREE --   THYROIDAB --   No results found for this basename: VITAMINB12:2,FOLATE:2,FERRITIN:2,TIBC:2,IRON:2,RETICCTPCT:2 in the last 72 hours  Micro Results: Recent Results (from the past 240 hour(s))  URINE CULTURE     Status: Normal   Collection Time   01/14/12  2:49 PM      Component Value Range Status Comment   Specimen Description URINE, CLEAN CATCH   Final    Special Requests NONE   Final    Culture  Setup Time [id number]   Final    Colony Count 40,000 COLONIES/ML   Final    Culture     Final    Value: Multiple bacterial morphotypes present, none predominant. Suggest appropriate recollection if clinically indicated.   Report Status 01/16/2012 FINAL   Final   CULTURE, BLOOD (ROUTINE X 2)     Status: Normal (Preliminary result)   Collection Time   01/14/12  4:50 PM      Component Value Range Status Comment   Specimen Description BLOOD RIGHT ARM   Final    Special Requests BOTTLES DRAWN AEROBIC AND ANAEROBIC   Final    Culture  Setup Time [id number]   Final    Culture  Final    Value:        BLOOD CULTURE RECEIVED NO GROWTH TO DATE CULTURE WILL BE HELD FOR 5 DAYS BEFORE ISSUING A FINAL NEGATIVE REPORT   Report Status PENDING   Incomplete   CULTURE, BLOOD (ROUTINE X 2)     Status: Normal (Preliminary result)   Collection Time   01/14/12  5:05 PM      Component Value Range Status Comment   Specimen Description BLOOD RIGHT HAND   Final    Special Requests BOTTLES DRAWN AEROBIC AND ANAEROBIC   Final    Culture  Setup Time [id number]   Final    Culture     Final    Value:        BLOOD CULTURE RECEIVED NO GROWTH TO DATE CULTURE WILL BE HELD FOR 5 DAYS BEFORE ISSUING A FINAL NEGATIVE REPORT   Report Status PENDING   Incomplete     Studies/Results: No results found.  Medications: I have reviewed the patient's current medications.  Patient Active Hospital Problem List: Generalized weakness (01/14/2012) At this point most likely due to CAP/COPD exacerbation.   HYPERTENSION  (02/11/2008) Not well controlled currently.  Given that patient has had an increase in his creatinine and initially had hyperkalemia at this point will plan on discontinuing patient lisinopril.  Since patient has had elevated K levels and uncontrolled hypertension, will plan on adding chlorthalidone and will administer hydralazine only if SBP > 180 or diastolic > 110.  Fall (01/14/2012) Will place on fall precautions. Patient feels like his strength is back to baseline.   CAP (community acquired pneumonia) (01/14/2012) Patient's WBC count keeps fluctuating.  Will plan on switching patient over to Zithromax and Rocephin and will continue to monitor WBC count.  COPD exacerbation (01/14/2012) Likely causing #1. Will plan on continuing this regimen. Given that patient feels like he is closer to baseline and he does not have wheezes on physical exam will discontinue the steroids as I feel that this may be causing his leukocytosis and not offering much benefit.   Hyperkalemia (01/14/2012) Resolved currently.  Will place on HCTZ and monitor for changes  CKD (chronic kidney disease) stage 3, GFR 30-59 ml/min (01/14/2012) Will continue to monitor. Patient seems to be at baseline.   ARF (acute renal failure) (01/14/2012) Likely prerenal. Will give IVF's today and reassess tomorrow in the am.   Hyperglycemia: Will check hgb A1c at this point. Place on insulin sliding scale and monitor cbg readings. Likely due to recent steroid regimen. His last HgbA1c was 5.9.  I have decided to d/c steroid and monitor patient's cbg's. He will most likely not require much insulin correction and I suspect that we will have to recommend lifestyle modification options once patient is ready for discharge.     LOS: 3 days   Penny Pia M.D.  [facility] 01/17/2012, 2:49 PM

## 2012-01-17 NOTE — Progress Notes (Signed)
Rocephin IV per pharmacy ordered for PNA. No renal adjustment necessary. Will start Rocephin IV 1g q24h and s/o. Reconsult if necessary. Duration of therapy per MD.  Gwen Her PharmD  [phone] 01/17/2012 2:51 PM

## 2012-01-17 NOTE — Progress Notes (Signed)
Occupational Therapy Treatment Patient Details Name: Walter [name] MRN: [id number] DOB: 02/05/1930 Today's Date: 01/17/2012 Time: 1131-1150 OT Time Calculation (min): 19 min  OT Assessment / Plan / Recommendation Comments on Treatment Session Pt doing fairly well.  Pt will feel better about things when swelling is less in legs.  This was topic of most of the 15 minute treatment session.    Follow Up Recommendations  Supervision/Assistance - 24 hour    Equipment Recommendations  None recommended by OT    Frequency Min 2X/week   Plan Discharge plan remains appropriate    Precautions / Restrictions Precautions Precautions: Fall Restrictions Weight Bearing Restrictions: No   Pertinent Vitals/Pain Pt with no c/o pain    ADL  Eating/Feeding: Performed;Independent Where Assessed - Eating/Feeding: Chair Grooming: Performed;Wash/dry hands;Supervision/safety Where Assessed - Grooming: Standing at sink Lower Body Bathing: Performed;Minimal assistance;Other (comment) (to reach bottom of feet) Where Assessed - Lower Body Bathing: Sitting, chair Lower Body Dressing: Performed;Moderate assistance;Other (comment) (Pt donned socks but needed assist b/c feet so wet.) Where Assessed - Lower Body Dressing: Sitting, chair;Sit to stand from chair Equipment Used: Gait belt Ambulation Related to ADLs: Pt walked in room to sink with VCs to stay closer to walker. ADL Comments: Pt fatigues quickly.  Pt very fixated on and upset with swelling in his feet that now make his LE ADLS more difficult.    OT Goals Acute Rehab OT Goals OT Goal Formulation: With patient Time For Goal Achievement: 01/22/12 Potential to Achieve Goals: Good ADL Goals Pt Will Perform Grooming: with modified independence;Standing at sink ADL Goal: Grooming - Progress: Progressing toward goals Pt Will Perform Lower Body Dressing: with modified independence;Sitting, bed;with adaptive equipment ADL Goal: Lower Body Dressing -  Progress: Progressing toward goals  Visit Information  Last OT Received On: 01/17/12 Assistance Needed: +1 PT/OT Co-Evaluation/Treatment: Yes    Subjective Data      Prior Functioning       Cognition  Overall Cognitive Status: Appears within functional limits for tasks assessed/performed Arousal/Alertness: Awake/alert Orientation Level: Appears intact for tasks assessed Behavior During Session: [facility] for tasks performed Cognition - Other Comments: Pt is impulsive at times when he was up walking and does perseverate at times on his problems.  Safety is a mild issue when this patient is up on his feet.    Mobility Transfers Transfers: Sit to Stand;Stand to Sit Sit to Stand: 5: Supervision Stand to Sit: 5: Supervision;With armrests;To chair/3-in-1 Details for Transfer Assistance: Pt's transfers safe.  Safety became more of an issue when walking and turning.  See PT note.   Exercises    Balance    End of Session OT - End of Session Equipment Utilized During Treatment: Gait belt Activity Tolerance: Patient tolerated treatment well Patient left: in chair;with call bell/phone within reach;with family/visitor present Nurse Communication: Mobility status   Hope Budds 01/17/2012, 12:13 PM [phone]

## 2012-01-17 NOTE — Progress Notes (Signed)
Physical Therapy Treatment Patient Details Name: Walter [name] MRN: [id number] DOB: [birth year] Today's Date: 01/17/2012 Time: 1130-1150 PT Time Calculation (min): 20 min  PT Assessment / Plan / Recommendation Comments on Treatment Session  Pt somewhat resistive at first, but then more agreeable as therapy session progressed.  B LE swelling in feet that is frustrating the pt.    Follow Up Recommendations  Home health PT;Supervision for mobility/OOB           Frequency Min 3X/week   Plan Discharge plan remains appropriate    Precautions / Restrictions Precautions Precautions: Fall        Mobility  Transfers Sit to Stand: 5: Supervision Ambulation/Gait Ambulation/Gait Assistance: 4: Min guard Ambulation Distance (Feet): 200 Feet Assistive device: Rolling walker Ambulation/Gait Assistance Details: Pt needing cues to stay closer to RW and for posture.  Cues with turning for smaller steps. Gait Pattern: Decreased step length - left;Decreased hip/knee flexion - left General Gait Details: Pt verbalized (spontaneously) that he needed walker for safety at this point.         PT Goals Acute Rehab PT Goals PT Goal Formulation: With patient PT Goal: Sit to Stand - Progress: Progressing toward goal PT Goal: Stand to Sit - Progress: Progressing toward goal PT Goal: Ambulate - Progress: Progressing toward goal  Visit Information  Last PT Received On: 01/17/12 Assistance Needed: +1 PT/OT Co-Evaluation/Treatment: Yes    Subjective Data  Subjective: Pt agreeable to PT on 2nd attempt with PT.  "I am waiting to talk to the doctor about why my feet are so swollen." Patient Stated Goal: Go home   Cognition  Overall Cognitive Status: Appears within functional limits for tasks assessed/performed Arousal/Alertness: Awake/alert Orientation Level: Appears intact for tasks assessed Behavior During Session: [facility] for tasks performed         End of Session PT - End of Session Equipment  Utilized During Treatment: Gait belt Activity Tolerance: Patient tolerated treatment well Patient left: in chair    Walter [name] 01/17/2012, 12:03 PM

## 2012-01-18 DIAGNOSIS — D696 Thrombocytopenia, unspecified: Secondary | ICD-10-CM

## 2012-01-18 DIAGNOSIS — E875 Hyperkalemia: Secondary | ICD-10-CM

## 2012-01-18 DIAGNOSIS — R7989 Other specified abnormal findings of blood chemistry: Secondary | ICD-10-CM

## 2012-01-18 DIAGNOSIS — J438 Other emphysema: Secondary | ICD-10-CM

## 2012-01-18 LAB — CBC
MCH: 29.9 pg (ref 26.0–34.0)
MCV: 92.5 fL (ref 78.0–100.0)
Platelets: 362 10*3/uL (ref 150–400)
RDW: 15.4 % (ref 11.5–15.5)
WBC: 12.7 10*3/uL — ABNORMAL HIGH (ref 4.0–10.5)

## 2012-01-18 LAB — BASIC METABOLIC PANEL
Calcium: 8.5 mg/dL (ref 8.4–10.5)
Creatinine, Ser: 1.94 mg/dL — ABNORMAL HIGH (ref 0.50–1.35)
GFR calc Af Amer: 36 mL/min — ABNORMAL LOW (ref >90)

## 2012-01-18 MED ORDER — AZITHROMYCIN 250 MG PO TABS: ORAL_TABLET | ORAL | Status: AC

## 2012-01-18 MED ORDER — CHLORTHALIDONE 25 MG PO TABS: 25.0000 mg | ORAL_TABLET | Freq: Every day | ORAL | Status: DC

## 2012-01-18 MED ORDER — CEPHALEXIN 500 MG PO CAPS: 500.0000 mg | ORAL_CAPSULE | Freq: Two times a day (BID) | ORAL | Status: AC

## 2012-01-18 NOTE — Discharge Summary (Signed)
Physician Discharge Summary  Walter [name], Walter [name] Male, 78 y.o., May 06, 1930  Admit date: 01/14/2012 Discharge date: 01/18/2012  Primary Care Physician:  Eartha Inch, MD, MD   Discharge Diagnoses:   No resolved problems to display.  Active Hospital Problems  Diagnoses Date Noted   . Generalized weakness 01/14/2012   . COPD exacerbation 01/14/2012     Priority: High  . CAP (community acquired pneumonia) 01/14/2012     Priority: Medium  . Hyperkalemia 01/14/2012     Priority: Medium  . Hyperglycemia 01/15/2012   . Leukocytosis 01/15/2012   . Fall 01/14/2012   . CKD (chronic kidney disease) stage 3, GFR 30-59 ml/min 01/14/2012   . ARF (acute renal failure) 01/14/2012   . HYPERTENSION 02/11/2008     Resolved Hospital Problems  Diagnoses Date Noted Date Resolved     DISCHARGE MEDICATION: Medication List  As of 01/18/2012 11:24 AM   STOP taking these medications         ALPRAZolam 0.5 MG tablet      lisinopril 40 MG tablet         TAKE these medications         azithromycin 250 MG tablet   Commonly known as: ZITHROMAX   Take one tab orally daily for the next four days.      beta carotene w/minerals tablet   Take 2 tablets by mouth daily.      cephALEXin 500 MG capsule   Commonly known as: KEFLEX   Take 1 capsule (500 mg total) by mouth 2 (two) times daily.      chlorthalidone 25 MG tablet   Commonly known as: HYGROTON   Take 1 tablet (25 mg total) by mouth daily.      cholecalciferol 1000 UNITS tablet   Commonly known as: VITAMIN D   Take 1,000 Units by mouth daily.      gabapentin 100 MG capsule   Commonly known as: NEURONTIN   Take 100 mg by mouth 2 (two) times daily.      omeprazole 20 MG capsule   Commonly known as: PRILOSEC   Take 20 mg by mouth daily.      ranitidine 300 MG tablet   Commonly known as: ZANTAC   Take 300 mg by mouth at bedtime.      silodosin 8 MG Caps capsule   Commonly known as: RAPAFLO   Take 8 mg by mouth daily with breakfast.        Tamsulosin HCl 0.4 MG Caps   Commonly known as: FLOMAX   Take 0.4 mg by mouth.      tiotropium 18 MCG inhalation capsule   Commonly known as: SPIRIVA   Place 18 mcg into inhaler and inhale daily.      traZODone 50 MG tablet   Commonly known as: DESYREL   Take 50 mg by mouth at bedtime.              Consults:     SIGNIFICANT DIAGNOSTIC STUDIES:  Ct Head Wo Contrast  01/14/2012  *RADIOLOGY REPORT*  Clinical Data: Fall, confusion, incontinence  CT HEAD WITHOUT CONTRAST  Technique:  Contiguous axial images were obtained from the base of the skull through the vertex without contrast.  Comparison: 02/13/2011  Findings: No evidence of parenchymal hemorrhage or extra-axial fluid collection. No mass lesion, mass effect, or midline shift.  No CT evidence of acute infarction.  Extensive small vessel ischemic changes.  Intracranial atherosclerosis.  Global cortical atrophy.  Secondary ventricular prominence, unchanged from  prior studies.  Mucous retention cyst in the bilateral maxillary sinuses.  The visualized paranasal sinuses are otherwise clear.  Mild partial opacification of the right mastoid air cells, chronic.  No evidence of calvarial fracture.  IMPRESSION: No evidence of acute intracranial abnormality.  Atrophy with stable ventricular prominence.  Extensive small vessel ischemic changes and intracranial atherosclerosis.  Original Report Authenticated By: Charline Bills, M.D.   Dg Chest Port 1 View  01/17/2012  *RADIOLOGY REPORT*  Clinical Data: COPD exacerbation.  Hypertension  PORTABLE CHEST - 1 VIEW  Comparison: 01/14/2012  Findings:  Heart size and mediastinal contours are normal.  There is no pleural effusion identified.  Patchy density within the right lung base is not significantly improved from previous exam.  There are coarsened interstitial markings identified elsewhere compatible with COPD.  IMPRESSION:  1.  No change in patchy opacities in the right base.  Original Report  Authenticated By: Rosealee Albee, M.D.   Dg Chest Port 1 View  01/14/2012  *RADIOLOGY REPORT*  Clinical Data: Difficulty breathing.  PORTABLE CHEST - 1 VIEW  Comparison: 10/24/2010  Findings: Patchy bilateral lower lobe airspace opacities are again noted.  These have slightly increased since prior study.  Cannot exclude acute infiltrates.  Underlying COPD.  Heart is normal size. No effusions or acute bony abnormality.  IMPRESSION: Patchy bilateral lower lobe opacities, right greater than left. Cannot exclude pneumonia.  COPD.  Original Report Authenticated By: Cyndie Chime, M.D.      CARDIAC CATH & OTHER PROCEDURES:  Recent Results (from the past 240 hour(s))  URINE CULTURE     Status: Normal   Collection Time   01/14/12  2:49 PM      Component Value Range Status Comment   Specimen Description URINE, CLEAN CATCH   Final    Special Requests NONE   Final    Culture  Setup Time [id number]   Final    Colony Count 40,000 COLONIES/ML   Final    Culture     Final    Value: Multiple bacterial morphotypes present, none predominant. Suggest appropriate recollection if clinically indicated.   Report Status 01/16/2012 FINAL   Final   CULTURE, BLOOD (ROUTINE X 2)     Status: Normal (Preliminary result)   Collection Time   01/14/12  4:50 PM      Component Value Range Status Comment   Specimen Description BLOOD RIGHT ARM   Final    Special Requests BOTTLES DRAWN AEROBIC AND ANAEROBIC   Final    Culture  Setup Time [id number]   Final    Culture     Final    Value:        BLOOD CULTURE RECEIVED NO GROWTH TO DATE CULTURE WILL BE HELD FOR 5 DAYS BEFORE ISSUING A FINAL NEGATIVE REPORT   Report Status PENDING   Incomplete   CULTURE, BLOOD (ROUTINE X 2)     Status: Normal (Preliminary result)   Collection Time   01/14/12  5:05 PM      Component Value Range Status Comment   Specimen Description BLOOD RIGHT HAND   Final    Special Requests BOTTLES DRAWN AEROBIC AND ANAEROBIC   Final    Culture   Setup Time [id number]   Final    Culture     Final    Value:        BLOOD CULTURE RECEIVED NO GROWTH TO DATE CULTURE WILL BE HELD FOR 5 DAYS BEFORE ISSUING A FINAL NEGATIVE REPORT  Report Status PENDING   Incomplete   RAPID STREP SCREEN     Status: Normal   Collection Time   01/17/12  4:24 PM      Component Value Range Status Comment   Streptococcus, Group A Screen (Direct) NEGATIVE  NEGATIVE  Final     BRIEF ADMITTING H & P: (From original HPI) MAKAYLA CONFER is an 78 y.o. male with a PMH of COPD who states that he fell when he attempted to get up and fell to the floor. He was unable to get up on his own. Denies any dizziness or loss of consciousness. No complaints of shortness of breath, no fever or chills, no cough. No dysuria or diarrhea. The patient does not have any specific complaints. Initial evaluation by the emergency department physician, a chest x-ray was done which showed possible pneumonia and he has been referred inpatient treatment.    Patient Active Hospital Problem List:  Generalized weakness (01/14/2012) At this point most likely due to CAP/COPD exacerbation.   HYPERTENSION (02/11/2008) Given that patient has had an increase in his creatinine and initially had hyperkalemia at this point will plan on discontinuing patient's lisinopril. Will place on chlorthalidone and have patient follow up with primary care physicians  Fall (01/14/2012) Patient feels like his strength is back to baseline. Will have patient transition to home with physical therapy.  CAP (community acquired pneumonia) (01/14/2012) Patient's WBC count keeps fluctuating. Will plan on switching patient over to Zithromax and cephalosporin.  His WBC count is improving on this regimen.  Will have him finish his antibiotics and follow up with his primary care physician.   COPD exacerbation (01/14/2012) Improved on antibiotics, nebs, and home regimen.  Was on prednisone but patient had marked leukocytosis and  hyperglycemia.  Hyperkalemia (01/14/2012) Resolved currently. Will place on HCTZ and monitor for changes.  Was elevated on lisinopril and has improved off lisinopril.  CKD (chronic kidney disease) stage 3, GFR 30-59 ml/min (01/14/2012) Will continue to monitor. Patient seems to be at baseline. Improved with IVF's.    No resolved problems to display.  Active Hospital Problems  Diagnoses Date Noted   . Generalized weakness 01/14/2012   . COPD exacerbation 01/14/2012     Priority: High  . CAP (community acquired pneumonia) 01/14/2012     Priority: Medium  . Hyperkalemia 01/14/2012     Priority: Medium  . Hyperglycemia 01/15/2012   . Leukocytosis 01/15/2012   . Fall 01/14/2012   . CKD (chronic kidney disease) stage 3, GFR 30-59 ml/min 01/14/2012   . ARF (acute renal failure) 01/14/2012   . HYPERTENSION 02/11/2008     Resolved Hospital Problems  Diagnoses Date Noted Date Resolved     Disposition and Follow-up: as indicated below. Discharge Orders    Future Orders Please Complete By Expires   Diet - low sodium heart healthy      Increase activity slowly      Discharge instructions      Comments:   Please take all antibiotics as indicated.  You will require walker while ambulating at home and I will set up home health PT for you. Discontinue taking Lisinopril as you had hyperkalemia on this medication. Follow up with your primary care physician in 1 week or sooner should any other conditions arise.   Call MD for:  temperature >100.4      Call MD for:  difficulty breathing, headache or visual disturbances      Home Health  Questions: Responses:   To provide the following care/treatments PT   Face-to-face encounter      Comments:   I Penny Pia certify that this patient is under my care and that I, or a nurse practitioner or physician's assistant working with me, had a face-to-face encounter that meets the physician face-to-face encounter requirements with this patient on  01/18/2012.       Questions: Responses:   The encounter with the patient was in whole, or in part, for the following medical condition, which is the primary reason for home health care COPD exacerbation   I certify that, based on my findings, the following services are medically necessary home health services Physical therapy   My clinical findings support the need for the above services High Risk for rehospitalization   Further, I certify that my clinical findings support that this patient is homebound (i.e. absences from home require considerable and taxing effort and are for medical reasons or religious services or infrequently or of short duration when for other reasons) Shortness of Breath with activity   To provide the following care/treatments PT        DISCHARGE EXAM:  General: Alert, awake, oriented x3, in no acute distress. HEENT:atraumatic, normocephalic, EOMI, PERRLA, normal exterior appearance, No bruits, no goiter. Heart: Regular rate and rhythm, without murmurs, rubs, gallops. Lungs: Clear to auscultation bilaterally. Abdomen: Soft, nontender, nondistended, positive bowel sounds. Extremities: No clubbing cyanosis or edema with positive pedal pulses. Neuro: Grossly intact, nonfocal.   Blood pressure 175/80, pulse 80, temperature 97.9 F (36.6 C), temperature source Oral, resp. rate 17, height 5' 11.5" (1.816 m), weight 79.606 kg (175 lb 8 oz), SpO2 97.00%.   Basename 01/18/12 0658 01/17/12 0346  NA 137 137  K 4.9 5.0  CL 107 107  CO2 20 20  GLUCOSE 82 126*  BUN 47* 45*  CREATININE 1.94* 2.14*  CALCIUM 8.5 7.9*  MG -- --  PHOS -- --   No results found for this basename: AST:2,ALT:2,ALKPHOS:2,BILITOT:2,PROT:2,ALBUMIN:2 in the last 72 hours No results found for this basename: LIPASE:2,AMYLASE:2 in the last 72 hours  Basename 01/18/12 0658 01/17/12 1150  WBC 12.7* 24.8*  NEUTROABS -- --  HGB 11.1* 11.8*  HCT 34.3* 36.2*  MCV 92.5 93.1  PLT 362 394     Signed: Penny Pia M.D. 01/18/2012, 11:24 AM

## 2012-01-18 NOTE — Progress Notes (Signed)
   CARE MANAGEMENT NOTE 01/18/2012  Patient:  Walter [name], Walter [name]   Account Number:  [id number]  Date Initiated:  01/18/2012  Documentation initiated by:  [facility]  Subjective/Objective Assessment:   COPD, pneumonia     Action/Plan:   lives at home with wife   Anticipated DC Date:  01/18/2012   Anticipated DC Plan:  HOME W HOME HEALTH SERVICES      DC Planning Services  CM consult      [facility] Choice  HOME HEALTH   Choice offered to / List presented to:  C-1 Patient        HH arranged  HH-2 PT      [facility] agency  [facility]   Status of service:  Completed, signed off Medicare Important Message given?   (If response is "NO", the following Medicare IM given date fields will be blank) Date Medicare IM given:   Date Additional Medicare IM given:    Discharge Disposition:  HOME W HOME HEALTH SERVICES  Per UR Regulation:    If discussed at Long Length of Stay Meetings, dates discussed:    Comments:  01/18/2012 1145 Spoke to pt and pt wishes to have [facility]  agency that will accept his insurance. Gave pt preference of agencies and requested [facility]. Pt states he has all needed DME at home. Contacted [facility] for [facility] PT for scheduled d/c today. Isidoro Donning RN CCM Case Mgmt phone [phone]

## 2012-01-21 LAB — CULTURE, BLOOD (ROUTINE X 2)
Culture  Setup Time: 201305010121
Culture: NO GROWTH

## 2012-01-23 ENCOUNTER — Encounter (HOSPITAL_COMMUNITY): Payer: Self-pay | Admitting: *Deleted

## 2012-01-23 ENCOUNTER — Emergency Department (HOSPITAL_COMMUNITY)
Admission: EM | Admit: 2012-01-23 | Discharge: 2012-01-23 | Disposition: A | Discharge status: Home / Self Care | Payer: Medicare Other | Attending: Emergency Medicine | Admitting: Emergency Medicine

## 2012-01-23 DIAGNOSIS — I824Z9 Acute embolism and thrombosis of unspecified deep veins of unspecified distal lower extremity: Secondary | ICD-10-CM | POA: Insufficient documentation

## 2012-01-23 DIAGNOSIS — J449 Chronic obstructive pulmonary disease, unspecified: Secondary | ICD-10-CM | POA: Insufficient documentation

## 2012-01-23 DIAGNOSIS — Z7901 Long term (current) use of anticoagulants: Secondary | ICD-10-CM | POA: Insufficient documentation

## 2012-01-23 DIAGNOSIS — I82402 Acute embolism and thrombosis of unspecified deep veins of left lower extremity: Secondary | ICD-10-CM

## 2012-01-23 DIAGNOSIS — J4489 Other specified chronic obstructive pulmonary disease: Secondary | ICD-10-CM | POA: Insufficient documentation

## 2012-01-23 DIAGNOSIS — I10 Essential (primary) hypertension: Secondary | ICD-10-CM | POA: Insufficient documentation

## 2012-01-23 DIAGNOSIS — I498 Other specified cardiac arrhythmias: Secondary | ICD-10-CM | POA: Insufficient documentation

## 2012-01-23 DIAGNOSIS — K219 Gastro-esophageal reflux disease without esophagitis: Secondary | ICD-10-CM | POA: Insufficient documentation

## 2012-01-23 LAB — DIFFERENTIAL
Basophils Relative: 0 % (ref 0–1)
Eosinophils Absolute: 0.1 10*3/uL (ref 0.0–0.7)
Monocytes Absolute: 1.2 10*3/uL — ABNORMAL HIGH (ref 0.1–1.0)
Monocytes Relative: 10 % (ref 3–12)

## 2012-01-23 LAB — BASIC METABOLIC PANEL
BUN: 39 mg/dL — ABNORMAL HIGH (ref 6–23)
Chloride: 104 mEq/L (ref 96–112)
Creatinine, Ser: 2 mg/dL — ABNORMAL HIGH (ref 0.50–1.35)
GFR calc Af Amer: 35 mL/min — ABNORMAL LOW (ref >90)

## 2012-01-23 LAB — CBC
HCT: 34.9 % — ABNORMAL LOW (ref 39.0–52.0)
Hemoglobin: 11.4 g/dL — ABNORMAL LOW (ref 13.0–17.0)
MCH: 29.9 pg (ref 26.0–34.0)
MCHC: 32.7 g/dL (ref 30.0–36.0)
MCV: 91.6 fL (ref 78.0–100.0)

## 2012-01-23 MED ORDER — ENOXAPARIN SODIUM 80 MG/0.8ML ~~LOC~~ SOLN
80.0000 mg | SUBCUTANEOUS | Status: DC
  Administered 2012-01-23: 80 mg via SUBCUTANEOUS
  Filled 2012-01-23: qty 0.8

## 2012-01-23 MED ORDER — PATIENT'S GUIDE TO USING COUMADIN BOOK
Freq: Once | Status: AC
  Administered 2012-01-23: 22:00:00
  Filled 2012-01-23: qty 1

## 2012-01-23 MED ORDER — ENOXAPARIN (LOVENOX) PATIENT EDUCATION KIT
PACK | Freq: Once | Status: AC
  Administered 2012-01-23: 22:00:00
  Filled 2012-01-23: qty 1

## 2012-01-23 MED ORDER — WARFARIN SODIUM 5 MG PO TABS: 5.0000 mg | ORAL_TABLET | Freq: Every day | ORAL | Status: DC

## 2012-01-23 MED ORDER — ENOXAPARIN SODIUM 80 MG/0.8ML ~~LOC~~ SOLN: 80.0000 mg | Freq: Every day | SUBCUTANEOUS | Status: DC

## 2012-01-23 NOTE — ED Provider Notes (Signed)
History     CSN: [id number]  Arrival date & time 01/23/12  1737   First MD Initiated Contact with Patient 01/23/12 1819      Chief Complaint  Patient presents with  . Leg Pain    HPI Patient presents to the emergency room after being diagnosed with a deep venous thrombosis by his primary Dr. Patient had been in the hospital recently treated for pneumonia. He was seeing his doctor for followup but also noticed it had swelling in his left leg. Patient had an outpatient Doppler ultrasound that showed a DVT in the posterior tibial vein of the left leg. The popliteal and femoral vein were clear.  Patient denies any chest pain or shortness of breath. He denies any fevers, chills or cough. He denies any vomiting or diarrhea. Patient states he would like to go home. Past Medical History  Diagnosis Date  . COPD (chronic obstructive pulmonary disease)   . Renal disorder   . Hypertension   . BPH (benign prostatic hyperplasia)   . GERD (gastroesophageal reflux disease)   . Neuropathy     Past Surgical History  Procedure Date  . Tonsillectomy   . Appendectomy   . Hemorrhoid surgery     Family History  Problem Relation Age of Onset  . Alcohol abuse Father   . Heart failure Father     History  Substance Use Topics  . Smoking status: Former Games developer  . Smokeless tobacco: Never Used  . Alcohol Use: No      Review of Systems  All other systems reviewed and are negative.    Allergies  Review of patient's allergies indicates no known allergies.  Home Medications   Current Outpatient Rx  Name Route Sig Dispense Refill  . AZITHROMYCIN 250 MG PO TABS  Take one tab orally daily for the next four days. 4 each 0  . OCUVITE PO TABS Oral Take 2 tablets by mouth daily.    . CEPHALEXIN 500 MG PO CAPS Oral Take 1 capsule (500 mg total) by mouth 2 (two) times daily. 16 capsule 0  . CHLORTHALIDONE 25 MG PO TABS Oral Take 1 tablet (25 mg total) by mouth daily. 30 tablet 0  . VITAMIN D 1000  UNITS PO TABS Oral Take 1,000 Units by mouth daily.    Marland Kitchen GABAPENTIN 100 MG PO CAPS Oral Take 100 mg by mouth 2 (two) times daily.    Marland Kitchen OMEPRAZOLE 20 MG PO CPDR Oral Take 20 mg by mouth daily.    Marland Kitchen RANITIDINE HCL 300 MG PO TABS Oral Take 300 mg by mouth at bedtime.    Marland Kitchen SILODOSIN 8 MG PO CAPS Oral Take 8 mg by mouth daily with breakfast.    . TAMSULOSIN HCL 0.4 MG PO CAPS Oral Take 0.4 mg by mouth.    Marland Kitchen TIOTROPIUM BROMIDE MONOHYDRATE 18 MCG IN CAPS Inhalation Place 18 mcg into inhaler and inhale daily.    . TRAZODONE HCL 50 MG PO TABS Oral Take 50 mg by mouth at bedtime.      BP 142/65  Pulse 76  Temp(Src) 97.8 F (36.6 C) (Oral)  Resp 20  Ht 5\' 11"  (1.803 m)  Wt 174 lb (78.926 kg)  BMI 24.27 kg/m2  SpO2 98%  Physical Exam  Nursing note and vitals reviewed. Constitutional: He appears well-developed and well-nourished. No distress.  HENT:  Head: Normocephalic and atraumatic.  Right Ear: External ear normal.  Left Ear: External ear normal.  Eyes: Conjunctivae are normal. Right  eye exhibits no discharge. Left eye exhibits no discharge. No scleral icterus.  Neck: Neck supple. No JVD present. No tracheal deviation present.  Cardiovascular: Normal rate, regular rhythm and intact distal pulses.   Pulmonary/Chest: Effort normal and breath sounds normal. No stridor. No respiratory distress. He has no wheezes. He has no rales. He exhibits no tenderness.       Port-A-Cath right chest wall  Abdominal: Soft. Bowel sounds are normal. He exhibits no distension. There is no tenderness. There is no rebound and no guarding.  Musculoskeletal: He exhibits edema and tenderness.       Erythema left medial calf, mild tenderness palpation, no lymphangitic streaking  Neurological: He is alert. He has normal strength. No sensory deficit. Cranial nerve deficit:  no gross defecits noted. He exhibits normal muscle tone. He displays no seizure activity. Coordination normal.  Skin: Skin is warm and dry. No rash  noted.  Psychiatric: He has a normal mood and affect.    ED Course  Procedures (including critical care time)  Rate: 110  Rhythm: sinus tachycardia  QRS Axis: Borderline right axis deviation  Intervals: normal  ST/T Wave abnormalities: normal  Conduction Disutrbances:none  Narrative Interpretation: Abnormal  Old EKG Reviewed: none available  Labs Reviewed  CBC - Abnormal; Notable for the following:    WBC 13.0 (*)    RBC 3.81 (*)    Hemoglobin 11.4 (*)    HCT 34.9 (*)    All other components within normal limits  DIFFERENTIAL - Abnormal; Notable for the following:    Neutrophils Relative 79 (*)    Neutro Abs 10.3 (*)    Lymphocytes Relative 10 (*)    Monocytes Absolute 1.2 (*)    All other components within normal limits  BASIC METABOLIC PANEL - Abnormal; Notable for the following:    BUN 39 (*)    Creatinine, Ser 2.00 (*)    GFR calc non Af Amer 30 (*)    GFR calc Af Amer 35 (*)    All other components within normal limits  PROTIME-INR  APTT   No results found.   1. DVT (deep venous thrombosis), left       MDM  Patient is not a candidate for pradaxa or xarelto considering his renal failure. Pharmacy assisted in dosing his Lovenox. Patient has been given a dose of Lovenox this evening. He'll be discharged home with a prescription for Lovenox and Coumadin. We've instructed him home Lovenox injections. I also requested the case manager to contact home health service to assist with this as well. Patient states he does not want to be admitted to the hospital. He had been in the hospital recently for his pneumonia and really wants to go back home .  I feel  this plan is reasonable and I explained to the patient the importance of following up with his doctor to have his blood levels rechecked       Celene Kras, MD 01/23/12 2059

## 2012-01-23 NOTE — ED Notes (Signed)
Pt states "went to primary MD today, was seen by PA, US done & has a clot in left leg"; spouse states "they also said his country function is worse"

## 2012-01-23 NOTE — Discharge Instructions (Signed)
Deep Vein Thrombosis A deep vein thrombosis (DVT) is a blood clot (thrombus) that develops in a deep vein. A DVT is a clot in the deep, larger veins of the leg, arm, or pelvis. These are more dangerous than clots that might form in veins on the surface of the body. Deep vein thrombosis can lead to complications if the clot breaks off and travels in the bloodstream to the lungs. CAUSES Blood clots form in a vein for different reasons. Usually several things cause blood clots. They include:  The flow of blood slows down.   The inside of the vein is damaged in some way.   The person has a condition that makes blood clot more easily. These conditions may include:   Older age (especially over 75 years old).   Having a history of blood clots.   Having major or lengthy surgery. Hip surgery is particularly high-risk.   Breaking a hip or leg.   Sitting or lying still for a long time.   Cancer or cancer treatment.   Having a long, thin tube (catheter) placed inside a vein during a medical procedure.   Being overweight (obese).   Pregnancy and childbirth.   Medicines with estrogen.   Smoking.   Other circulation or heart problems.  SYMPTOMS When a clot forms, it can either partially or totally block the blood flow in that vein. Symptoms of a DVT can include:  Swelling of the leg or arm, especially if one side is much worse.   Warmth and redness of the leg or arm, especially if one side is much worse.   Pain in an arm or leg. If the clot is in the leg, symptoms may be more noticeable or worse when standing or walking.  If the blood clot travels to the lung, it may cause:  Shortness of breath.   Chest pain. The pain may be worsened by deep breaths.   Coughing up thick mucus (phlegm), possibly flecked with blood.  Anyone with these symptoms should get emergency medical treatment right away. Call your local emergency services (911 in [place] if you have these symptoms. DIAGNOSIS If  a DVT is suspected, your caregiver will take a full medical history. He or she will also perform a physical exam. Tests that also may be required include:  Studies of the clotting properties of the blood.   An ultrasound scan.   X-rays to show the flow of blood when special dye is injected into the veins (venography).   Studies of your lungs if you have any chest symptoms.  PREVENTION  Exercise the legs regularly. Take a brisk 30 minute walk every day.   Maintain a weight that is appropriate for your height.   Avoid sitting or lying in bed for long periods of time without moving your legs.   Women, particularly those over the age of 35, should consider the risks and benefits of taking estrogen medicines, including birth control pills.   Do not smoke, especially if you take estrogen medicines.   Long-distance travel can increase your risk. You should exercise your legs by walking or pumping the muscles every hour.   In hospital prevention:   Prevention may include medical and nonmedical measures.  TREATMENT  The most common treatment for DVT is blood thinning (anticoagulant) medicine, which reduces the blood's tendency to clot. Anticoagulants can stop new blood clots from forming and old ones from growing. They cannot dissolve existing clots. Your body does this by itself over time.   Anticoagulants can be given by mouth, by intravenous (IV) access, or by injection. Your caregiver will determine the best program for you.   Less commonly, clot-dissolving drugs (thrombolytics) are used to dissolve a DVT. They carry a high risk of bleeding, so they are used mainly in severe cases.   Very rarely, a blood clot in the leg needs to be removed surgically.   If you are unable to take anticoagulants, your caregiver may arrange for you to have a filter placed in a main vein in your belly (abdomen). This filter prevents clots from traveling to your lungs.  HOME CARE INSTRUCTIONS  Take all  medicines prescribed by your caregiver. Follow the directions carefully.   You will most likely continue taking anticoagulants after you leave the hospital. Your caregiver will advise you on the length of treatment (usually 3 to 6 months, sometimes for life).   Taking too much or too little of an anticoagulant is dangerous. While taking this type of medicine, you will need to have regular blood tests to be sure the dose is correct. The dose can change for many reasons. It is critically important that you take this medicine exactly as prescribed, and that you have blood tests exactly as directed.   Many foods can interfere with anticoagulants. These include foods high in vitamin K, such as spinach, kale, broccoli, cabbage, collard and turnip greens, Brussels sprouts, peas, cauliflower, seaweed, parsley, beef and pork liver, green tea, and soybean oil. Your caregiver should discuss limits on these foods with you or you should arrange a visit with a dietician to answer your questions.   Many medicines can interfere with anticoagulants. You must tell your caregiver about any and all medicines you take. This includes all vitamins and supplements. Be especially cautious with aspirin and anti-inflammatory medicines. Ask your caregiver before taking these.   Anticoagulants can have side effects, mostly excessive bruising or bleeding. You will need to hold pressure over cuts for longer than usual. Avoid alcoholic drinks or consume only very small amounts while taking this medicine.   If you are taking an anticoagulant:   Wear a medical alert bracelet.   Notify your dentist or other caregivers before procedures.   Avoid contact sports.   Ask your caregiver how soon you can go back to normal activities. Not being active can lead to new clots. Ask for a list of what you should and should not do.   Exercise your lower leg muscles. This is important while traveling.   You may need to wear compression  stockings. These are tight elastic stockings that apply pressure to the lower legs. This can help keep the blood in the legs from clotting.   If you are a smoker, you should quit.   Learn as much as you can about DVT.  SEEK MEDICAL CARE IF:  You have unusual bruising or any bleeding problems.   The swelling or pain in your affected arm or leg is not gradually improving.   You anticipate surgery or long-distance travel. You should get specific advice on DVT prevention.   You discover other family members with blood clots. This may require further testing for inherited diseases or conditions.  SEEK IMMEDIATE MEDICAL CARE IF:  You develop chest pain.   You develop severe shortness of breath.   You begin to cough up bloody mucus or phlegm (sputum).   You feel dizzy or faint.   You develop swelling or pain in the leg.   You have   breathing problems after traveling.  MAKE SURE YOU:  Understand these instructions.   Will watch your condition.   Will get help right away if you are not doing well or get worse.  Document Released: 09/02/2005 Document Revised: 08/22/2011 Document Reviewed: 10/25/2010 ExitCare Patient Information 2012 [employer]. 

## 2012-01-23 NOTE — ED Notes (Signed)
Pt reports he was discharged from hospital recently after being treated for PNA. Pt noted swelling to left foot and redness to medial foot. Pt went to PCP and was [facility] where he was diagnosed with a blood clot. Pt was told to come to ER for treatment.  Pt denies being treated by PCP.

## 2012-01-23 NOTE — ED Notes (Signed)
Patients daughter taught how to given the medications. Patient is awaiting for Home Health Nurse to call and assist. Specific instructions given. Patient d/c'd with wife and 2 e scripts and 1 rx for a walker

## 2012-01-23 NOTE — Progress Notes (Signed)
ANTICOAGULATION CONSULT NOTE - Initial Consult  Pharmacy Consult for Lovenox Indication: DVT  No Known Allergies  Patient Measurements: Height: 5\' 11"  (180.3 cm) Weight: 174 lb (78.926 kg) IBW/kg (Calculated) : 75.3   Vital Signs: Temp: 97.8 F (36.6 C) (05/09 1742) Temp src: Oral (05/09 1742) BP: 142/65 mmHg (05/09 1742) Pulse Rate: 76  (05/09 1742)  Labs:  Basename 01/23/12 1854  HGB 11.4*  HCT 34.9*  PLT 359  APTT 30  LABPROT 13.1  INR 0.97  HEPARINUNFRC --  CREATININE 2.00*  CKTOTAL --  CKMB --  TROPONINI --    Estimated Creatinine Clearance: 31.9 ml/min (by C-G formula based on Cr of 2).   Medical History: Past Medical History  Diagnosis Date  . COPD (chronic obstructive pulmonary disease)   . Renal disorder   . Hypertension   . BPH (benign prostatic hyperplasia)   . GERD (gastroesophageal reflux disease)   . Neuropathy     Medications:  Scheduled:    . enoxaparin (LOVENOX) injection  80 mg Subcutaneous Q24H  . enoxaparin   Does not apply Once    Assessment:  Recent admit for PNA 4/30-5/4.  LLE swelling and redness noted, DVT noted at [facility].  Hx CKD, clearance approximately 30 ml/min  Lovenox dosed q 24hr for renal insufficiency  Goal of Therapy:  Heparin level 0.3-0.7 units/ml if level warranted. Monitor platelets by anticoagulation protocol: Yes   Plan:  Lovenox 80mg  sq q24h Monitor renal function,  Heparin level likely warranted with renal dysfunction No Coumadin order yet  Otho Bellows PharmD Pager [phone] 01/23/2012,8:33 PM

## 2012-01-23 NOTE — ED Notes (Signed)
Pt urinated on bed.  Changed sheets and emptied urinal.

## 2012-01-24 NOTE — Progress Notes (Signed)
ED CM contacted by Darl Pikes From [facility] to update her on pt being able to get neighbor to assist on 01/24/12 and will need assist from [facility] on 01/25/12 Wife/pt expecting call from [facility] staff

## 2012-01-24 NOTE — Progress Notes (Signed)
Pharmacy: Brief Warfarin note Pt with documented DVT per [facility]. Sent to [facility] ED for treatment 5/9 pm. Received Lovenox 80mg  in ED, sent home with prescriptions for  Lovenox 80mg  daily x 5 days and Warfarin 5mg  daily # ten tablets.    Patient left ED before we could counsel on Warfarin. I called the home today and spoke with patient's wife about Warfarin. Answered questions and concerns. Coumadin to begin today after prescription filled.     Plan for protime/INR Monday/Tuesday at PCP office of Dr. Cyndia Bent. Provided our phone number at [facility] if any questions before PCP visit.  Thank you, Versie Starks 01/24/2012 3:55 PM

## 2012-01-24 NOTE — Progress Notes (Signed)
Choices offered to pt/wife are below  HOME HEALTH AGENCIES SERVING [place]   Agencies that are Medicare-Certified and are affiliated with [facility]  Telephone Number Address  [facility]   [facility] has ownership interest in this company; however, you are under no obligation to use this agency. [phone] or  [phone] [address] [place], [place] [address]   Agencies that are Medicare-Certified and are not affiliated with [facility] Telephone Number Address  [facility] [phone] Fax [phone] [address], [address] [place], [place]  [address]  [facility] [phone] or [phone] Fax [phone] [address] [place], [place] [address]  [facility] [phone] Fax [phone] [address] [place], [place] [address]  [facility] [phone] Fax [phone] [address] [place], [place]  [address]  [facility] [phone] Fax [phone] [address] [place], [place] [address]  [facility] [phone] [address] [place], [place] [address]  [facility] [phone]  [address] [place], [place] [address]  [facility] [phone] or [phone] Fax [phone] [address] [place], [place]  [address]  [facility] [phone] Fax [phone] [address] [place], [place]  [address]  [facility]  [phone] Fax [phone] [address] [place], [place] [address]

## 2012-01-24 NOTE — Progress Notes (Signed)
Completed electronic referral to [facility] via [facility] with confirmation of fax received

## 2012-01-24 NOTE — Progress Notes (Signed)
Discussed wife concern with Morrie Sheldon Dr badger prefers one time  lovenox shot to be given in evening.  Spoke with wife again who confirms daughter who was taught to provide injection is out of town.  Wife informed CM she and pt were okay with pt receiving shot on this evening with assistance of her "neighbor" CM provided wife Cm contact information and [facility] contact information Informed her Cm has spoken with [facility] and Morrie Sheldon at Dr Cyndia Bent office.

## 2012-01-24 NOTE — Progress Notes (Signed)
Voice message left for Dr badger and/or his RN for [facility] orders at [phone] Received call from Darl Pikes of [facility] stating no availability for pt to be seen until Call back from [place] at Dr badger will fax orders to [phone] for Cm to process

## 2012-01-24 NOTE — Progress Notes (Signed)
ED CM spoke with wife who states pt received lovenox shot in ED at 10 pm reports pt had [facility]) after hip surgery and they choose to have [facility] services for lovenox injection and administration teaching. Wife reports pt is in "poor health with kidney, blood clot and getting over pneumonia issues." Wife reports cancelling her appointment to see her dr to stay with pt Daughter is out of town CM left voice message for Darl Pikes of [facility] [phone]) about need for [facility] services.

## 2012-01-27 ENCOUNTER — Emergency Department (HOSPITAL_COMMUNITY)
Admission: EM | Admit: 2012-01-27 | Discharge: 2012-01-27 | Disposition: A | Discharge status: Home / Self Care | Payer: Medicare Other | Attending: Emergency Medicine | Admitting: Emergency Medicine

## 2012-01-27 ENCOUNTER — Encounter (HOSPITAL_COMMUNITY): Payer: Self-pay | Admitting: *Deleted

## 2012-01-27 ENCOUNTER — Emergency Department (HOSPITAL_COMMUNITY): Payer: Medicare Other

## 2012-01-27 DIAGNOSIS — Z7901 Long term (current) use of anticoagulants: Secondary | ICD-10-CM | POA: Insufficient documentation

## 2012-01-27 DIAGNOSIS — R4182 Altered mental status, unspecified: Secondary | ICD-10-CM | POA: Insufficient documentation

## 2012-01-27 DIAGNOSIS — K219 Gastro-esophageal reflux disease without esophagitis: Secondary | ICD-10-CM | POA: Insufficient documentation

## 2012-01-27 DIAGNOSIS — F0390 Unspecified dementia without behavioral disturbance: Secondary | ICD-10-CM | POA: Insufficient documentation

## 2012-01-27 DIAGNOSIS — J449 Chronic obstructive pulmonary disease, unspecified: Secondary | ICD-10-CM | POA: Insufficient documentation

## 2012-01-27 DIAGNOSIS — J4489 Other specified chronic obstructive pulmonary disease: Secondary | ICD-10-CM | POA: Insufficient documentation

## 2012-01-27 DIAGNOSIS — F29 Unspecified psychosis not due to a substance or known physiological condition: Secondary | ICD-10-CM | POA: Insufficient documentation

## 2012-01-27 DIAGNOSIS — I10 Essential (primary) hypertension: Secondary | ICD-10-CM | POA: Insufficient documentation

## 2012-01-27 DIAGNOSIS — Z79899 Other long term (current) drug therapy: Secondary | ICD-10-CM | POA: Insufficient documentation

## 2012-01-27 LAB — BASIC METABOLIC PANEL
BUN: 38 mg/dL — ABNORMAL HIGH (ref 6–23)
Calcium: 8.8 mg/dL (ref 8.4–10.5)
Creatinine, Ser: 2.18 mg/dL — ABNORMAL HIGH (ref 0.50–1.35)
GFR calc Af Amer: 31 mL/min — ABNORMAL LOW (ref >90)
GFR calc non Af Amer: 27 mL/min — ABNORMAL LOW (ref >90)
Glucose, Bld: 92 mg/dL (ref 70–99)
Potassium: 4.4 mEq/L (ref 3.5–5.1)

## 2012-01-27 LAB — CBC
Hemoglobin: 12.9 g/dL — ABNORMAL LOW (ref 13.0–17.0)
MCH: 30.3 pg (ref 26.0–34.0)
MCHC: 33.2 g/dL (ref 30.0–36.0)
MCV: 91.1 fL (ref 78.0–100.0)

## 2012-01-27 LAB — DIFFERENTIAL
Basophils Relative: 0 % (ref 0–1)
Eosinophils Absolute: 0.1 10*3/uL (ref 0.0–0.7)
Eosinophils Relative: 1 % (ref 0–5)
Lymphs Abs: 1.1 10*3/uL (ref 0.7–4.0)
Monocytes Absolute: 1 10*3/uL (ref 0.1–1.0)
Monocytes Relative: 7 % (ref 3–12)
Neutrophils Relative %: 84 % — ABNORMAL HIGH (ref 43–77)

## 2012-01-27 LAB — PROTIME-INR: INR: 1.44 (ref 0.00–1.49)

## 2012-01-27 LAB — URINALYSIS, ROUTINE W REFLEX MICROSCOPIC
Bilirubin Urine: NEGATIVE
Ketones, ur: NEGATIVE mg/dL
Nitrite: NEGATIVE
Protein, ur: >300 mg/dL — AB
Urobilinogen, UA: 0.2 mg/dL (ref 0.0–1.0)

## 2012-01-27 LAB — URINE MICROSCOPIC-ADD ON

## 2012-01-27 NOTE — ED Notes (Signed)
Per pt 's wife the pt was calling pcp today and was unable to recall calling pcp. Pt has also began to become forgetful. Pt denies any weakness on either side. Pt denies headache or blurred vision. Pt is able to move all ext to command

## 2012-01-27 NOTE — ED Notes (Signed)
Patient transported to CT 

## 2012-01-27 NOTE — Discharge Instructions (Signed)
Your CAT scan does not show a new stroke, tumor, or blood in your brain.  Your INR is 1.4.  4.  This is too low.  Take an extra dosage of your Coumadin tonight before you go to sleep.  Followup with your Dr. for reevaluation.  Return for worse or uncontrolled symptoms

## 2012-01-27 NOTE — ED Provider Notes (Signed)
History     CSN: [id number]  Arrival date & time 01/27/12  1805   First MD Initiated Contact with Patient 01/27/12 1853      Chief Complaint  Patient presents with  . Altered Mental Status    (Consider location/radiation/quality/duration/timing/severity/associated sxs/prior treatment) The history is provided by the patient and the spouse.   the patient is a 78 year old, male, with mild dementia.  He also has a history of a DVT.  He was at his doctor's office today to have his Coumadin checked.  They noted that he was confused, so they sent him to the emergency department for evaluation of possible stroke.  His wife states that he has been a bit more confused today, as well.  He has not been sick with fevers, cough, vomiting, diarrhea, or any other symptoms.  Recently.  Past Medical History  Diagnosis Date  . COPD (chronic obstructive pulmonary disease)   . Renal disorder   . Hypertension   . BPH (benign prostatic hyperplasia)   . GERD (gastroesophageal reflux disease)   . Neuropathy     Past Surgical History  Procedure Date  . Tonsillectomy   . Appendectomy   . Hemorrhoid surgery     Family History  Problem Relation Age of Onset  . Alcohol abuse Father   . Heart failure Father     History  Substance Use Topics  . Smoking status: Former Games developer  . Smokeless tobacco: Never Used  . Alcohol Use: No      Review of Systems  Constitutional: Negative for fever.  Eyes: Negative for visual disturbance.  Respiratory: Negative for cough.   Cardiovascular: Negative for chest pain.  Gastrointestinal: Negative for vomiting and abdominal pain.  Neurological: Negative for headaches.  Psychiatric/Behavioral: Positive for confusion.  All other systems reviewed and are negative.    Allergies  Review of patient's allergies indicates no known allergies.  Home Medications   Current Outpatient Rx  Name Route Sig Dispense Refill  . OCUVITE PO TABS Oral Take 2 tablets by  mouth daily.    . CEPHALEXIN 500 MG PO CAPS Oral Take 1 capsule (500 mg total) by mouth 2 (two) times daily. 16 capsule 0  . CHLORTHALIDONE 25 MG PO TABS Oral Take 1 tablet (25 mg total) by mouth daily. 30 tablet 0  . VITAMIN D 1000 UNITS PO TABS Oral Take 1,000 Units by mouth daily.    Marland Kitchen ENOXAPARIN SODIUM 80 MG/0.8ML [place] SOLN Subcutaneous Inject 0.8 mLs (80 mg total) into the skin daily. 5 Syringe 1  . GABAPENTIN 100 MG PO CAPS Oral Take 100 mg by mouth 2 (two) times daily.    Marland Kitchen OMEPRAZOLE 20 MG PO CPDR Oral Take 20 mg by mouth daily.    Marland Kitchen RANITIDINE HCL 300 MG PO TABS Oral Take 300 mg by mouth at bedtime.    Marland Kitchen SILODOSIN 8 MG PO CAPS Oral Take 8 mg by mouth daily with breakfast.    . TAMSULOSIN HCL 0.4 MG PO CAPS Oral Take 0.4 mg by mouth.    Marland Kitchen TIOTROPIUM BROMIDE MONOHYDRATE 18 MCG IN CAPS Inhalation Place 18 mcg into inhaler and inhale daily.    . TRAZODONE HCL 50 MG PO TABS Oral Take 50 mg by mouth at bedtime.    . WARFARIN SODIUM 5 MG PO TABS Oral Take 1 tablet (5 mg total) by mouth daily. 10 tablet 0    BP 171/71  Pulse 78  Temp(Src) 97.7 F (36.5 C) (Oral)  Resp 15  SpO2 96%  Physical Exam  Vitals reviewed.   ED Course  Procedures (including critical care time)  Labs Reviewed  CBC - Abnormal; Notable for the following:    WBC 13.6 (*)    Hemoglobin 12.9 (*)    HCT 38.8 (*)    All other components within normal limits  DIFFERENTIAL - Abnormal; Notable for the following:    Neutrophils Relative 84 (*)    Neutro Abs 11.4 (*)    Lymphocytes Relative 8 (*)    All other components within normal limits  BASIC METABOLIC PANEL - Abnormal; Notable for the following:    BUN 38 (*)    Creatinine, Ser 2.18 (*)    GFR calc non Af Amer 27 (*)    GFR calc Af Amer 31 (*)    All other components within normal limits  URINALYSIS, ROUTINE W REFLEX MICROSCOPIC - Abnormal; Notable for the following:    Hgb urine dipstick TRACE (*)    Protein, ur >300 (*)    All other components within  normal limits  URINE MICROSCOPIC-ADD ON   Ct Head Wo Contrast  01/27/2012  *RADIOLOGY REPORT*  Clinical Data: Altered mental status.  CT HEAD WITHOUT CONTRAST  Technique:  Contiguous axial images were obtained from the base of the skull through the vertex without contrast.  Comparison: 01/14/2012  Findings: Periventricular and corona radiata white matter hypodensities are most compatible with chronic ischemic microvascular white matter disease.  Bilateral small lacunar infarcts involving the thalami, pons, and basal ganglia appear stable.  No hydrocephalus, intracranial hemorrhage, mass lesion, or acute CVA is observed.  Polypoid mucoperiosteal thickening is present in both maxillary sinuses.  IMPRESSION:  1.  Single mucous retention cysts in both maxillary sinuses. 2.  Stable chronic ischemic microvascular white matter disease and stable remote thalamic, pontine, and basal ganglia lacunar infarcts.  No acute intracranial findings are observed.  Original Report Authenticated By: Dellia Cloud, M.D.     No diagnosis found.    MDM  Dementia No signs tumor, stroke, systemic illness. inr too low.  Will instruct to take extra dose        Cheri Guppy, MD 01/27/12 2149

## 2012-01-27 NOTE — ED Notes (Signed)
Patient discharged via wheelchair with wife. Respirations  equal and unlabored. Skin warm and dry. No acute distress noted.

## 2012-01-27 NOTE — ED Notes (Signed)
Pt reports having headache located in occipital area starting approx 10-15 minutes ago.

## 2012-01-27 NOTE — ED Notes (Signed)
Dr. Caporossi at bedside. 

## 2012-01-27 NOTE — ED Notes (Signed)
Pt is A/O x4. Denies any complaints except discomfort to left foot where blood clot is located. Pt is on lovenox and coumadin for this. Per pt's wife pt has been more confused today and was calling his doctor's office several times. Pt denies any confusion. No slurred speech, facial droop, or unilateral weakness noted. Grip strength equal and strong. PERRL. NAD noted at this time.

## 2012-03-31 ENCOUNTER — Ambulatory Visit: Payer: Medicare Other | Attending: Family Medicine | Admitting: Rehabilitative and Restorative Service Providers"

## 2012-03-31 DIAGNOSIS — IMO0001 Reserved for inherently not codable concepts without codable children: Secondary | ICD-10-CM | POA: Insufficient documentation

## 2012-03-31 DIAGNOSIS — R269 Unspecified abnormalities of gait and mobility: Secondary | ICD-10-CM | POA: Insufficient documentation

## 2012-03-31 DIAGNOSIS — M6281 Muscle weakness (generalized): Secondary | ICD-10-CM | POA: Insufficient documentation

## 2012-04-02 ENCOUNTER — Ambulatory Visit: Payer: Medicare Other | Admitting: Physical Therapy

## 2012-04-06 ENCOUNTER — Ambulatory Visit: Payer: Medicare Other | Admitting: Physical Therapy

## 2012-04-09 ENCOUNTER — Ambulatory Visit: Payer: Medicare Other | Admitting: Physical Therapy

## 2012-04-13 ENCOUNTER — Ambulatory Visit: Payer: Medicare Other | Admitting: Physical Therapy

## 2012-04-16 ENCOUNTER — Ambulatory Visit: Payer: Medicare Other | Admitting: Rehabilitative and Restorative Service Providers"

## 2012-04-16 ENCOUNTER — Ambulatory Visit: Payer: Medicare Other | Attending: Family Medicine | Admitting: Physical Therapy

## 2012-04-16 DIAGNOSIS — IMO0001 Reserved for inherently not codable concepts without codable children: Secondary | ICD-10-CM | POA: Insufficient documentation

## 2012-04-16 DIAGNOSIS — R269 Unspecified abnormalities of gait and mobility: Secondary | ICD-10-CM | POA: Insufficient documentation

## 2012-04-16 DIAGNOSIS — M6281 Muscle weakness (generalized): Secondary | ICD-10-CM | POA: Insufficient documentation

## 2012-04-20 ENCOUNTER — Ambulatory Visit: Payer: Medicare Other | Admitting: Rehabilitative and Restorative Service Providers"

## 2012-04-20 ENCOUNTER — Ambulatory Visit: Payer: Medicare Other | Admitting: Physical Therapy

## 2012-04-21 ENCOUNTER — Ambulatory Visit: Payer: Medicare Other | Admitting: Rehabilitative and Restorative Service Providers"

## 2012-04-23 ENCOUNTER — Ambulatory Visit: Payer: Medicare Other | Admitting: Rehabilitative and Restorative Service Providers"

## 2012-04-27 ENCOUNTER — Encounter (HOSPITAL_COMMUNITY): Payer: Self-pay | Admitting: *Deleted

## 2012-04-27 ENCOUNTER — Observation Stay (HOSPITAL_COMMUNITY)
Admission: EM | Admit: 2012-04-27 | Discharge: 2012-04-28 | Disposition: A | Discharge status: Home / Self Care | Payer: Medicare Other | Attending: Family Medicine | Admitting: Family Medicine

## 2012-04-27 ENCOUNTER — Observation Stay (HOSPITAL_COMMUNITY): Payer: Medicare Other

## 2012-04-27 ENCOUNTER — Ambulatory Visit: Payer: Medicare Other | Admitting: Rehabilitative and Restorative Service Providers"

## 2012-04-27 ENCOUNTER — Emergency Department (HOSPITAL_COMMUNITY): Payer: Medicare Other

## 2012-04-27 DIAGNOSIS — N400 Enlarged prostate without lower urinary tract symptoms: Secondary | ICD-10-CM | POA: Insufficient documentation

## 2012-04-27 DIAGNOSIS — N183 Chronic kidney disease, stage 3 unspecified: Secondary | ICD-10-CM | POA: Insufficient documentation

## 2012-04-27 DIAGNOSIS — R7309 Other abnormal glucose: Secondary | ICD-10-CM

## 2012-04-27 DIAGNOSIS — Z7901 Long term (current) use of anticoagulants: Secondary | ICD-10-CM | POA: Insufficient documentation

## 2012-04-27 DIAGNOSIS — E785 Hyperlipidemia, unspecified: Secondary | ICD-10-CM | POA: Insufficient documentation

## 2012-04-27 DIAGNOSIS — Z86718 Personal history of other venous thrombosis and embolism: Secondary | ICD-10-CM | POA: Insufficient documentation

## 2012-04-27 DIAGNOSIS — I129 Hypertensive chronic kidney disease with stage 1 through stage 4 chronic kidney disease, or unspecified chronic kidney disease: Secondary | ICD-10-CM | POA: Insufficient documentation

## 2012-04-27 DIAGNOSIS — J441 Chronic obstructive pulmonary disease with (acute) exacerbation: Secondary | ICD-10-CM

## 2012-04-27 DIAGNOSIS — E875 Hyperkalemia: Secondary | ICD-10-CM | POA: Insufficient documentation

## 2012-04-27 DIAGNOSIS — J449 Chronic obstructive pulmonary disease, unspecified: Secondary | ICD-10-CM | POA: Diagnosis present

## 2012-04-27 DIAGNOSIS — Z79899 Other long term (current) drug therapy: Secondary | ICD-10-CM | POA: Insufficient documentation

## 2012-04-27 DIAGNOSIS — W19XXXA Unspecified fall, initial encounter: Secondary | ICD-10-CM

## 2012-04-27 DIAGNOSIS — J438 Other emphysema: Secondary | ICD-10-CM

## 2012-04-27 DIAGNOSIS — R531 Weakness: Secondary | ICD-10-CM

## 2012-04-27 DIAGNOSIS — N179 Acute kidney failure, unspecified: Secondary | ICD-10-CM | POA: Insufficient documentation

## 2012-04-27 DIAGNOSIS — J4489 Other specified chronic obstructive pulmonary disease: Secondary | ICD-10-CM | POA: Insufficient documentation

## 2012-04-27 DIAGNOSIS — J189 Pneumonia, unspecified organism: Secondary | ICD-10-CM

## 2012-04-27 DIAGNOSIS — E1322 Other specified diabetes mellitus with diabetic chronic kidney disease: Secondary | ICD-10-CM | POA: Diagnosis present

## 2012-04-27 DIAGNOSIS — K219 Gastro-esophageal reflux disease without esophagitis: Secondary | ICD-10-CM | POA: Insufficient documentation

## 2012-04-27 DIAGNOSIS — R739 Hyperglycemia, unspecified: Secondary | ICD-10-CM

## 2012-04-27 DIAGNOSIS — I10 Essential (primary) hypertension: Secondary | ICD-10-CM | POA: Diagnosis present

## 2012-04-27 DIAGNOSIS — I658 Occlusion and stenosis of other precerebral arteries: Secondary | ICD-10-CM | POA: Insufficient documentation

## 2012-04-27 DIAGNOSIS — D72829 Elevated white blood cell count, unspecified: Secondary | ICD-10-CM

## 2012-04-27 DIAGNOSIS — R29898 Other symptoms and signs involving the musculoskeletal system: Principal | ICD-10-CM | POA: Insufficient documentation

## 2012-04-27 LAB — CBC WITH DIFFERENTIAL/PLATELET
Eosinophils Absolute: 0.2 10*3/uL (ref 0.0–0.7)
Eosinophils Relative: 2 % (ref 0–5)
Hemoglobin: 11.9 g/dL — ABNORMAL LOW (ref 13.0–17.0)
Lymphocytes Relative: 10 % — ABNORMAL LOW (ref 12–46)
Lymphs Abs: 0.8 10*3/uL (ref 0.7–4.0)
MCH: 30.2 pg (ref 26.0–34.0)
MCV: 92.1 fL (ref 78.0–100.0)
Monocytes Relative: 11 % (ref 3–12)
Neutrophils Relative %: 77 % (ref 43–77)
Platelets: 310 10*3/uL (ref 150–400)
RBC: 3.94 MIL/uL — ABNORMAL LOW (ref 4.22–5.81)
WBC: 8.4 10*3/uL (ref 4.0–10.5)

## 2012-04-27 LAB — BASIC METABOLIC PANEL
CO2: 25 mEq/L (ref 19–32)
Calcium: 8.9 mg/dL (ref 8.4–10.5)
Glucose, Bld: 94 mg/dL (ref 70–99)
Potassium: 5.6 mEq/L — ABNORMAL HIGH (ref 3.5–5.1)
Sodium: 138 mEq/L (ref 135–145)

## 2012-04-27 LAB — URINALYSIS, ROUTINE W REFLEX MICROSCOPIC
Glucose, UA: NEGATIVE mg/dL
Leukocytes, UA: NEGATIVE
Nitrite: NEGATIVE
Specific Gravity, Urine: 1.016 (ref 1.005–1.030)
pH: 6 (ref 5.0–8.0)

## 2012-04-27 LAB — URINE MICROSCOPIC-ADD ON

## 2012-04-27 LAB — GLUCOSE, CAPILLARY: Glucose-Capillary: 82 mg/dL (ref 70–99)

## 2012-04-27 LAB — RAPID URINE DRUG SCREEN, HOSP PERFORMED
Amphetamines: NOT DETECTED
Opiates: NOT DETECTED
Tetrahydrocannabinol: NOT DETECTED

## 2012-04-27 LAB — PROTIME-INR: Prothrombin Time: 26 seconds — ABNORMAL HIGH (ref 11.6–15.2)

## 2012-04-27 LAB — TROPONIN I: Troponin I: <0.3 ng/mL (ref <0.30)

## 2012-04-27 MED ORDER — VITAMIN D3 25 MCG (1000 UNIT) PO TABS
1000.0000 [IU] | ORAL_TABLET | Freq: Every day | ORAL | Status: DC
  Administered 2012-04-27 – 2012-04-28 (×2): 1000 [IU] via ORAL
  Filled 2012-04-27 (×2): qty 1

## 2012-04-27 MED ORDER — SODIUM CHLORIDE 0.9 % IV SOLN
Freq: Once | INTRAVENOUS | Status: AC
  Administered 2012-04-27: 23:00:00 via INTRAVENOUS

## 2012-04-27 MED ORDER — TRAZODONE HCL 50 MG PO TABS
50.0000 mg | ORAL_TABLET | Freq: Every day | ORAL | Status: DC
  Administered 2012-04-27: 50 mg via ORAL
  Filled 2012-04-27 (×2): qty 1

## 2012-04-27 MED ORDER — WARFARIN - PHYSICIAN DOSING INPATIENT: Freq: Every day | Status: DC

## 2012-04-27 MED ORDER — LORAZEPAM 2 MG/ML IJ SOLN
1.0000 mg | Freq: Once | INTRAMUSCULAR | Status: AC
  Administered 2012-04-27: 1 mg via INTRAVENOUS

## 2012-04-27 MED ORDER — TAMSULOSIN HCL 0.4 MG PO CAPS
0.4000 mg | ORAL_CAPSULE | Freq: Every day | ORAL | Status: DC
  Administered 2012-04-28: 0.4 mg via ORAL
  Filled 2012-04-27: qty 1

## 2012-04-27 MED ORDER — ASPIRIN 325 MG PO TABS
325.0000 mg | ORAL_TABLET | Freq: Every day | ORAL | Status: DC
  Administered 2012-04-27 – 2012-04-28 (×2): 325 mg via ORAL
  Filled 2012-04-27 (×2): qty 1

## 2012-04-27 MED ORDER — SODIUM CHLORIDE 0.9 % IV SOLN
Freq: Once | INTRAVENOUS | Status: AC
Start: 2012-04-27 — End: 2012-04-27
  Administered 2012-04-27: 15:00:00 via INTRAVENOUS

## 2012-04-27 MED ORDER — HYDRALAZINE HCL 10 MG PO TABS
10.0000 mg | ORAL_TABLET | Freq: Four times a day (QID) | ORAL | Status: DC
  Administered 2012-04-27 – 2012-04-28 (×4): 10 mg via ORAL
  Filled 2012-04-27 (×8): qty 1

## 2012-04-27 MED ORDER — SODIUM POLYSTYRENE SULFONATE 15 GM/60ML PO SUSP
15.0000 g | Freq: Once | ORAL | Status: AC
  Administered 2012-04-27: 15 g via ORAL
  Filled 2012-04-27: qty 60

## 2012-04-27 MED ORDER — WARFARIN SODIUM 5 MG PO TABS
5.0000 mg | ORAL_TABLET | Freq: Every day | ORAL | Status: DC
  Filled 2012-04-27: qty 1

## 2012-04-27 MED ORDER — GABAPENTIN 100 MG PO CAPS
200.0000 mg | ORAL_CAPSULE | Freq: Every day | ORAL | Status: DC
  Administered 2012-04-27: 200 mg via ORAL
  Filled 2012-04-27 (×2): qty 2

## 2012-04-27 MED ORDER — TIOTROPIUM BROMIDE MONOHYDRATE 18 MCG IN CAPS
18.0000 ug | ORAL_CAPSULE | Freq: Every day | RESPIRATORY_TRACT | Status: DC
  Administered 2012-04-28: 18 ug via RESPIRATORY_TRACT
  Filled 2012-04-27: qty 5

## 2012-04-27 MED ORDER — OCUVITE PO TABS
2.0000 | ORAL_TABLET | Freq: Every day | ORAL | Status: DC
  Administered 2012-04-27 – 2012-04-28 (×2): 2 via ORAL
  Filled 2012-04-27 (×2): qty 2

## 2012-04-27 MED ORDER — LORAZEPAM 2 MG/ML IJ SOLN
INTRAMUSCULAR | Status: AC
  Administered 2012-04-27: 1 mg via INTRAVENOUS
  Filled 2012-04-27: qty 1

## 2012-04-27 NOTE — ED Notes (Signed)
Pt's wife reports pt has been in rehab- went today and staff told him to come to ED- due to increased weakness. Wife reports pt's left foot dragging. No slurred speech, no facial droop, grips equal. Pt's PCP could not see pt today- told to come to ED. Denies Pain. No change in appetite.

## 2012-04-27 NOTE — H&P (Addendum)
[facility] History and Physical  Walter [name]:[id number] DOB: 1929-11-10 DOA: 04/27/2012  Referring physician: Rhunette Croft, MD  PCP: Eartha Inch, MD   Chief Complaint: Weakness  HPI:  78 yr old male with multiple co-morbidities presented to the ED today.  He states that he was on his way to PT as an out-patient, and therapy called his PCP and his wife came over here with him.  He states that his L foot was weak and he usually uses a walker.  He states that his L foot was "dragging"  He personally didn't realize it.  No LOC, NO Slurred speech,just dragging of the L foot-PT noted that he was weaker than usual at out-patient PT and he was hence sent over here. Wife concerned that he was going to have some sort of CVA  Review of Systems:  No chest pain no shortness of breath no nausea no vomiting no blurred vision no double vision no weakness currently. Ambulated in the emergency room with a walker up and down the hall.  Nursing cannot confirm this.  Patient had swallowing screen in the emergency room when he took his Kayexalate well.  Patient has no rash no dysuria no diarrhea.  Past Medical History  Diagnosis Date  . COPD (chronic obstructive pulmonary disease)   . Renal disorder   . Hypertension   . BPH (benign prostatic hyperplasia)   . GERD (gastroesophageal reflux disease)   . Neuropathy    Chart review  Seen in the emergency room 5/13 for confusion  Admitted 01/14/2012 for fall and possible pneumonia-community acquired  Admitted for altered mental status, chronic kidney disease, recurrent falls with declining to go to a skilled nursing facility and a history of reported CVAs 10/24/2010  Admitted 5/27 for hyperkalemia and acute on chronic renal insufficiency  Noted history of alcohol use-admitted 12/16/2009 for falls and syncopy-potentially iatrogenic secondary to thiazide/ACE inhibitor  Admitted 10/24/09 for AKI  Admission 02/02/07 for L total hip  replacement  Admit 10/22/06 for weakness/FTT/Fever  Admit 01/14/03 Hypoxia/PNa  Multiple spinal surgeries?  HLd  Chronic hand pain  ?Raynaud's syndrome  BPH  Past Surgical History  Procedure Date  . Tonsillectomy   . Appendectomy   . Hemorrhoid surgery   . Back surgery    Social History:  reports that he quit smoking about 12 years ago. He has never used smokeless tobacco. He reports that he does not drink alcohol or use illicit drugs. Patient lives at home with his wife and goes to outpatient therapy He can participate in most ADLs patient participate in ADLs?  No Known Allergies  Family History  Problem Relation Age of Onset  . Alcohol abuse Father   . Heart failure Father      Prior to Admission medications   Medication Sig Start Date End Date Taking? Authorizing Provider  beta carotene w/minerals (OCUVITE) tablet Take 2 tablets by mouth daily.   Yes Historical Provider, MD  cholecalciferol (VITAMIN D) 1000 UNITS tablet Take 1,000 Units by mouth daily.   Yes Historical Provider, MD  gabapentin (NEURONTIN) 100 MG capsule Take 200 mg by mouth at bedtime.    Yes Historical Provider, MD  lisinopril (PRINIVIL,ZESTRIL) 40 MG tablet Take 40 mg by mouth daily.   Yes Historical Provider, MD  omeprazole (PRILOSEC) 20 MG capsule Take 20 mg by mouth daily.   Yes Historical Provider, MD  silodosin (RAPAFLO) 8 MG CAPS capsule Take 8 mg by mouth daily with breakfast.   Yes Historical Provider,  MD  tiotropium (SPIRIVA) 18 MCG inhalation capsule Place 18 mcg into inhaler and inhale daily.   Yes Historical Provider, MD  traZODone (DESYREL) 50 MG tablet Take 50 mg by mouth at bedtime.   Yes Historical Provider, MD  warfarin (COUMADIN) 5 MG tablet Take 1 tablet (5 mg total) by mouth daily. 01/23/12 01/22/13 Yes Celene Kras, MD   Physical Exam: Filed Vitals:   04/27/12 1309 04/27/12 1334 04/27/12 1506 04/27/12 1620  BP: 106/48 144/59 167/63 146/65  Pulse: 72 66 61 67  Temp: 97.5 F (36.4  C)  97.8 F (36.6 C)   TempSrc: Oral  Oral   Resp: 20 16 16 20   SpO2: 98% 98% 99% 95%     General:  Very engaging pleasant Caucasian male looking about stated age  Eyes: No pallor no icterus external ocular movements intact, vision by direct confrontation is normal  ENT: Good dentition, uvula is midline tongue protrudes midline smile is symmetric facial folds are symmetric regarding of before it is symmetric jaw clenching his bilateral equally  Neck: Soft and supple no JVD noted no bruits noted  Cardiovascular: S1-S2 no murmur rub or gallop regular rate rhythm  Respiratory: Clinically clear no added sound, no tactile vocal resonance or fremitus  Abdomen: Soft nontender nondistended no rebound no guarding  Skin: Age-related changes over skin with seborrheic keratoses however no actinic keratosis noted  Musculoskeletal: Most joints and limbs move equally without restricted range of motion  Psychiatric: Euthymic and very pleasant  Neurologic: Cranial nerves II through XII are grossly intact on cursory examination-sensation is intact.  Reflexes are 2/3 in biceps, triceps, brachial radialis, knee, ankle bilaterally.  I do not appreciate any foot drop.  Power in the hip flexors knee extensors and flexors as well as ankle dorsiflexors and extensors are normal.  Labs on Admission:  Basic Metabolic Panel:  Lab 04/27/12 1610  NA 138  K 5.6*  CL 105  CO2 25  GLUCOSE 94  BUN 30*  CREATININE 2.08*  CALCIUM 8.9  MG --  PHOS --   Liver Function Tests: No results found for this basename: AST:5,ALT:5,ALKPHOS:5,BILITOT:5,PROT:5,ALBUMIN:5 in the last 168 hours No results found for this basename: LIPASE:5,AMYLASE:5 in the last 168 hours No results found for this basename: AMMONIA:5 in the last 168 hours CBC:  Lab 04/27/12 1449  WBC 8.4  NEUTROABS 6.5  HGB 11.9*  HCT 36.3*  MCV 92.1  PLT 310   Cardiac Enzymes:  Lab 04/27/12 1449  CKTOTAL --  CKMB --  CKMBINDEX --   TROPONINI <0.30    BNP (last 3 results) No results found for this basename: PROBNP:3 in the last 8760 hours CBG: No results found for this basename: GLUCAP:5 in the last 168 hours  Radiological Exams on Admission: Ct Head Wo Contrast  04/27/2012  *RADIOLOGY REPORT*  Clinical Data: Weakness  CT HEAD WITHOUT CONTRAST  Technique:  Contiguous axial images were obtained from the base of the skull through the vertex without contrast.  Comparison: 01/27/2012  Findings: There is no evidence for acute hemorrhage, hydrocephalus, mass lesion, or abnormal extra-axial fluid collection.  No definite CT evidence for acute infarction.  Diffuse loss of parenchymal volume is consistent with atrophy. Patchy low attenuation in the deep hemispheric and periventricular white matter is nonspecific, but likely reflects chronic microvascular ischemic demyelination. Bilateral lacunar infarcts in the basal ganglia are chronic.  Polypoid mucosal thickening in the maxillary sinuses is stable.  As before, there is fluid in the right mastoid  air cells.  IMPRESSION: Stable.  No acute intracranial abnormality.  Atrophy with chronic microvascular ischemic white matter demyelination.  Chronic lacunar infarcts within the basal ganglia bilaterally.  Original Report Authenticated By: ERIC A. MANSELL, M.D.    EKG: Independently reviewed.  EKG done today shows sinus rhythm first degree AV block incomplete right bundle branch block with some T-wave slurring in leads V4 through 6 with mild T-wave peaking however this is not really a significant change from prior EKG 01/28/2012  Assessment/Plan Principal Problem:  *Weakness of left foot Active Problems:  HYPERLIPIDEMIA  HYPERTENSION  COPD-stable  Hyperkalemia  CKD (chronic kidney disease) stage 3, GFR 30-59 ml/min  ARF (acute renal failure)   1. ?  TIA-neurology has requested admission of this pleasant male who on my current exam does not have any gross neurologic abnormality.   He has had an extensive workup in 10/2010 for CVA which was negative.  We will defer to neurology further plans and course of action.  I suspect his weakness was potentially more in keeping with volume depletion and will get orthostatic blood pressures-get MRI/MRA/Carotids/echo per stroke orders that and potential discharge tomorrow once this is complete 2. Acute renal insufficiency with hyperkalemia-patient given one dose of Kayexalate in the emergency room as there were some T-wave peaking on EKG.  We will repeat labs in the morning with a bmet and review his blood pressure medications.  I will counsel as an outpatient meds patient does not take thiazides or ACE inhibitors as he seems to have a history of having issues with both volume, and with hyperkalemia and he will potentially need different agents for his blood pressure see below 3. Hypertension-patient has uncontrolled stage III hypertension and we will place him on hydralazine 10 mg 3 times a day and up titrate as an inpatient.  We will discontinue his lisinopril from home and reassess him in the morning. 4. Anemia-baseline hemoglobin over the past 2 years has been in the 12-11 range.  We will hold on any inpatient workup the same.  He is on Coumadin for purported DVT in the past.  His INR is subtherapeutic at 1.44.  Given his history of falls and weakness, I feels it is reasonable to involve his primary care physician in this discussion and potentially change him to low-dose aspirin for some protection for DVT without the risk for fall and hemorrhage-this will of course have to be balenced with outcome of CVA work-up today 5. COPD this is stable at this present time-no need for other medications currently  Code Status: Full Family Communication: None at bedside Disposition Plan: Obs, telemetry bed-[place] team 5  Time spent: 1 hour  Mahala Menghini [facility] Pager [phone]  If 7PM-7AM, please contact  night-coverage [email or web address] Password [facility] 04/27/2012, 5:32 PM

## 2012-04-27 NOTE — ED Notes (Signed)
MD at bedside. 

## 2012-04-27 NOTE — Progress Notes (Addendum)
ANTICOAGULATION CONSULT NOTE - Initial Consult  Pharmacy Consult for:  Coumadin Indication:  History of DVT  No Known Allergies  Patient Measurements: Height: 5\' 11"  (180.3 cm) Weight: 177 lb 11.2 oz (80.604 kg) IBW/kg (Calculated) : 75.3   Vital Signs: Temp: 97.8 F (36.6 C) (08/12 2034) Temp src: Oral (08/12 2034) BP: 170/71 mmHg (08/12 2034) Pulse Rate: 73  (08/12 2034)  Labs:  Basename 04/27/12 1449  HGB 11.9*  HCT 36.3*  PLT 310  APTT --  LABPROT --  INR --  HEPARINUNFRC --  CREATININE 2.08*  CKTOTAL --  CKMB --  TROPONINI <0.30    Estimated Creatinine Clearance: 30.7 ml/min (by C-G formula based on Cr of 2.08).   Medical History: Past Medical History  Diagnosis Date  . COPD (chronic obstructive pulmonary disease)   . Renal disorder   . Hypertension   . BPH (benign prostatic hyperplasia)   . GERD (gastroesophageal reflux disease)   . Neuropathy     Medications:  Prescriptions prior to admission  Medication Sig Dispense Refill  . beta carotene w/minerals (OCUVITE) tablet Take 2 tablets by mouth daily.      . cholecalciferol (VITAMIN D) 1000 UNITS tablet Take 1,000 Units by mouth daily.      Marland Kitchen gabapentin (NEURONTIN) 100 MG capsule Take 200 mg by mouth at bedtime.       Marland Kitchen lisinopril (PRINIVIL,ZESTRIL) 40 MG tablet Take 40 mg by mouth daily.      Marland Kitchen omeprazole (PRILOSEC) 20 MG capsule Take 20 mg by mouth daily.      . silodosin (RAPAFLO) 8 MG CAPS capsule Take 8 mg by mouth daily with breakfast.      . tiotropium (SPIRIVA) 18 MCG inhalation capsule Place 18 mcg into inhaler and inhale daily.      . traZODone (DESYREL) 50 MG tablet Take 50 mg by mouth at bedtime.      Marland Kitchen warfarin (COUMADIN) 5 MG tablet Take 1 tablet (5 mg total) by mouth daily.  10 tablet  0    Assessment:  Asked to assist with Coumadin therapy for this 78 year-old male with history of DVT.  The home Coumadin dose is documented as 5 mg daily with the last dose taken on 8/11.  Goal of  Therapy:   INR 2-3  Prevention of VTE   Plan:  PT/INR tonight prior to giving tonight's dose of Coumadin, and daily thereafter.  Polo Riley R.Ph. 04/27/2012,9:42 PM

## 2012-04-27 NOTE — ED Provider Notes (Signed)
History     CSN: [id number]  Arrival date & time 04/27/12  1257   First MD Initiated Contact with Patient 04/27/12 1350      Chief Complaint  Patient presents with  . Weakness    (Consider location/radiation/quality/duration/timing/severity/associated sxs/prior treatment) HPI Comments: Pt comes in w/ cc of lower extremity weakness. Pt went to bed last night with no complains, but reportedly went to rehab today for PT, and was noted to be dragging his left leg. Pt was promptly send to the ED for a r/o stroke. Pt denies any headaches, n/v/f/c/chest pain/sob/trauma. He states that he did appreciate some weakness this morning, but didn't think much about it. He has no hx of strokes. Overtime, his strength has improved, and he feels that he is walking better - but still not baseline normal. There is no LUE weakness that he can appreciate. Wife at bedside, endorses that patient was dragging his leg when walking today.  Patient is a 78 y.o. male presenting with weakness. The history is provided by the patient, the spouse and medical records.  Weakness Primary symptoms do not include headaches, dizziness or fever.  Additional symptoms include weakness.    Past Medical History  Diagnosis Date  . COPD (chronic obstructive pulmonary disease)   . Renal disorder   . Hypertension   . BPH (benign prostatic hyperplasia)   . GERD (gastroesophageal reflux disease)   . Neuropathy     Past Surgical History  Procedure Date  . Tonsillectomy   . Appendectomy   . Hemorrhoid surgery   . Back surgery     Family History  Problem Relation Age of Onset  . Alcohol abuse Father   . Heart failure Father     History  Substance Use Topics  . Smoking status: Former Games developer  . Smokeless tobacco: Never Used  . Alcohol Use: No      Review of Systems  Constitutional: Negative for fever, chills and activity change.  HENT: Negative for neck pain.   Eyes: Negative for visual disturbance.    Respiratory: Negative for cough, chest tightness and shortness of breath.   Cardiovascular: Negative for chest pain.  Gastrointestinal: Negative for abdominal distention.  Genitourinary: Negative for dysuria, enuresis and difficulty urinating.  Musculoskeletal: Negative for arthralgias.  Neurological: Positive for weakness. Negative for dizziness, light-headedness and headaches.  Psychiatric/Behavioral: Negative for confusion.    Allergies  Review of patient's allergies indicates no known allergies.  Home Medications   Current Outpatient Rx  Name Route Sig Dispense Refill  . OCUVITE PO TABS Oral Take 2 tablets by mouth daily.    Marland Kitchen VITAMIN D 1000 UNITS PO TABS Oral Take 1,000 Units by mouth daily.    Marland Kitchen GABAPENTIN 100 MG PO CAPS Oral Take 200 mg by mouth at bedtime.     Marland Kitchen LISINOPRIL 40 MG PO TABS Oral Take 40 mg by mouth daily.    Marland Kitchen OMEPRAZOLE 20 MG PO CPDR Oral Take 20 mg by mouth daily.    Marland Kitchen SILODOSIN 8 MG PO CAPS Oral Take 8 mg by mouth daily with breakfast.    . TIOTROPIUM BROMIDE MONOHYDRATE 18 MCG IN CAPS Inhalation Place 18 mcg into inhaler and inhale daily.    . TRAZODONE HCL 50 MG PO TABS Oral Take 50 mg by mouth at bedtime.    . WARFARIN SODIUM 5 MG PO TABS Oral Take 1 tablet (5 mg total) by mouth daily. 10 tablet 0    BP 146/65  Pulse 67  Temp 97.8 F (36.6 C) (Oral)  Resp 20  SpO2 95%  Physical Exam  Constitutional: He is oriented to person, place, and time. He appears well-developed.  HENT:  Head: Normocephalic and atraumatic.  Eyes: Conjunctivae and EOM are normal. Pupils are equal, round, and reactive to light.  Neck: Normal range of motion. Neck supple.  Cardiovascular: Normal rate and regular rhythm.   Pulmonary/Chest: Effort normal and breath sounds normal.  Abdominal: Soft. Bowel sounds are normal. He exhibits no distension. There is no tenderness. There is no rebound and no guarding.  Neurological: He is alert and oriented to person, place, and time.   Skin: Skin is warm.    ED Course  Procedures (including critical care time)  Labs Reviewed  BASIC METABOLIC PANEL - Abnormal; Notable for the following:    Potassium 5.6 (*)     BUN 30 (*)     Creatinine, Ser 2.08 (*)     GFR calc non Af Amer 29 (*)     GFR calc Af Amer 33 (*)     All other components within normal limits  CBC WITH DIFFERENTIAL - Abnormal; Notable for the following:    RBC 3.94 (*)     Hemoglobin 11.9 (*)     HCT 36.3 (*)     Lymphocytes Relative 10 (*)     All other components within normal limits  URINALYSIS, ROUTINE W REFLEX MICROSCOPIC - Abnormal; Notable for the following:    Protein, ur 100 (*)     All other components within normal limits  URINE MICROSCOPIC-ADD ON - Abnormal; Notable for the following:    Bacteria, UA FEW (*)     All other components within normal limits  TROPONIN I   Ct Head Wo Contrast  04/27/2012  *RADIOLOGY REPORT*  Clinical Data: Weakness  CT HEAD WITHOUT CONTRAST  Technique:  Contiguous axial images were obtained from the base of the skull through the vertex without contrast.  Comparison: 01/27/2012  Findings: There is no evidence for acute hemorrhage, hydrocephalus, mass lesion, or abnormal extra-axial fluid collection.  No definite CT evidence for acute infarction.  Diffuse loss of parenchymal volume is consistent with atrophy. Patchy low attenuation in the deep hemispheric and periventricular white matter is nonspecific, but likely reflects chronic microvascular ischemic demyelination. Bilateral lacunar infarcts in the basal ganglia are chronic.  Polypoid mucosal thickening in the maxillary sinuses is stable.  As before, there is fluid in the right mastoid air cells.  IMPRESSION: Stable.  No acute intracranial abnormality.  Atrophy with chronic microvascular ischemic white matter demyelination.  Chronic lacunar infarcts within the basal ganglia bilaterally.  Original Report Authenticated By: ERIC A. MANSELL, M.D.     No diagnosis  found.    MDM   Date: 04/27/2012  Rate: 65  Rhythm: normal sinus rhythm  QRS Axis: left  Intervals: PR prolonged  ST/T Wave abnormalities: nonspecific ST/T changes  Conduction Disutrbances:first-degree A-V block , RBBB  Narrative Interpretation:   Old EKG Reviewed: unchanged  DDX: -TIA -Stroke -Hypoglycemia -Hypoxia -Metabolic disease process -Neuropathy -muscular disorder   Pt with hx of COPD, CKD, HTN comes in with cc of LLE weakness. Immediate concerns if for TIA, as his sx are improving or stroke with sx that are resolving. Pt is out of the TPA window. Pt's ABCD2 score is - 6 Neuro will be consulted.   5:15 PM Neuro recs are possible TIA with medical admission.          Maylie Ashton,  MD 04/27/12 1715

## 2012-04-27 NOTE — Consult Note (Signed)
Referring Physician: Derwood Kaplan    Chief Complaint: Left Leg weakness  HPI: Walter [name] is an 78 y.o. male who was in his normal state up until this morning, he went to physical therapy where was noticed that he had significant left leg weakness. Physical therapy was concerned about the weakness, and noticed that he was driving his foot, and asked him to go to see his doctor. His PCP was unable to see him, and referred him to the emergency room. At this time, he feels that his symptoms have resolved. He denies any pain, sensory change, or other associated symptoms. His wife is present is able to give a history as well. She states that he did seem to have left leg problems this morning, but did not notice any slurred speech, or other associated symptoms.  He was in physical therapy for a DVT of that leg, but stated that this was not similar to previous symptoms.  Also of note, his wife has noticed he does seem to have memory problems going on for some time.  LSN: Present on awakening tPA Given: No: Rapidly resolving symptoms and outside of the window  Past Medical History  Diagnosis Date  . COPD (chronic obstructive pulmonary disease)   . Renal disorder   . Hypertension   . BPH (benign prostatic hyperplasia)   . GERD (gastroesophageal reflux disease)   . Neuropathy     Past Surgical History  Procedure Date  . Tonsillectomy   . Appendectomy   . Hemorrhoid surgery   . Back surgery     Family History  Problem Relation Age of Onset  . Alcohol abuse Father   . Heart failure Father    stroke in father at age 11  Social History:  reports that he quit smoking about 12 years ago. He has never used smokeless tobacco. He reports that he does not drink alcohol or use illicit drugs. he does have a history of heavy drinking, quit when he was approximately 57.  Allergies: No Known Allergies  Medications:  Lisinopril Coumadin Spiriva  Trazodone Proventil  ROS: A 12 point  review of systems is performed and is negative except as noted in history of present illness  Physical Examination: Blood pressure 146/65, pulse 67, temperature 97.8 F (36.6 C), temperature source Oral, resp. rate 20, SpO2 95.00%.  Neurologic Examination: General: No apparent distress, lying in bed CV: Regular rate and rhythm, no carotid bruits Mental status: Awake, alert, month is "July", year is 2013, location is [facility] long hospital, able to perform serial sevens 5/5. He repeatedly asks the same questions. Cranial nerves: 2-visual fields full 3/4/6-EOMI, PERRL 5/7-face is symmetric to sensation and movement 8-hearing is intact to voice 9/10-uvula elevates symmetrically 11-shoulder shrug symmetric 12-Tongue midline Motor: 5/5 strength throughout. Patient initially gives on left ankle dorsiflexion, but with prompting is able to perform better. Tone is normal, bulk is normal Sensory: Symmetric, but decreased to vibration proprioception distally DTR: Absent ankle, present at the biceps and patella and symmetric Cerebellar: Intact finger to finger bilaterally  He has a positive palmomental reflex on the left, negative on the right  Laboratory Studies:  Basic Metabolic Panel:  Lab 04/27/12 7829  NA 138  K 5.6*  CL 105  CO2 25  GLUCOSE 94  BUN 30*  CREATININE 2.08*  CALCIUM 8.9  MG --  PHOS --    Liver Function Tests: No results found for this basename: AST:5,ALT:5,ALKPHOS:5,BILITOT:5,PROT:5,ALBUMIN:5 in the last 168 hours No results found for  this basename: LIPASE:5,AMYLASE:5 in the last 168 hours No results found for this basename: AMMONIA:3 in the last 168 hours  CBC:  Lab 04/27/12 1449  WBC 8.4  NEUTROABS 6.5  HGB 11.9*  HCT 36.3*  MCV 92.1  PLT 310    Cardiac Enzymes:  Lab 04/27/12 1449  CKTOTAL --  CKMB --  CKMBINDEX --  TROPONINI <0.30    BNP: No components found with this basename: POCBNP:5  CBG: No results found for this basename: GLUCAP:5  in the last 168 hours  Microbiology: Results for orders placed during the hospital encounter of 01/14/12  URINE CULTURE     Status: Normal   Collection Time   01/14/12  2:49 PM      Component Value Range Status Comment   Specimen Description URINE, CLEAN CATCH   Final    Special Requests NONE   Final    Culture  Setup Time [id number]   Final    Colony Count 40,000 COLONIES/ML   Final    Culture     Final    Value: Multiple bacterial morphotypes present, none predominant. Suggest appropriate recollection if clinically indicated.   Report Status 01/16/2012 FINAL   Final   CULTURE, BLOOD (ROUTINE X 2)     Status: Normal   Collection Time   01/14/12  4:50 PM      Component Value Range Status Comment   Specimen Description BLOOD RIGHT ARM   Final    Special Requests BOTTLES DRAWN AEROBIC AND ANAEROBIC   Final    Culture  Setup Time [id number]   Final    Culture NO GROWTH 5 DAYS   Final    Report Status 01/21/2012 FINAL   Final   CULTURE, BLOOD (ROUTINE X 2)     Status: Normal   Collection Time   01/14/12  5:05 PM      Component Value Range Status Comment   Specimen Description BLOOD RIGHT HAND   Final    Special Requests BOTTLES DRAWN AEROBIC AND ANAEROBIC   Final    Culture  Setup Time [id number]   Final    Culture NO GROWTH 5 DAYS   Final    Report Status 01/21/2012 FINAL   Final   RAPID STREP SCREEN     Status: Normal   Collection Time   01/17/12  4:24 PM      Component Value Range Status Comment   Streptococcus, Group A Screen (Direct) NEGATIVE  NEGATIVE Final     Coagulation Studies: No results found for this basename: LABPROT:5,INR:5 in the last 72 hours  Urinalysis:  Lab 04/27/12 1434  COLORURINE YELLOW  LABSPEC 1.016  PHURINE 6.0  GLUCOSEU NEGATIVE  HGBUR NEGATIVE  BILIRUBINUR NEGATIVE  KETONESUR NEGATIVE  PROTEINUR 100*  UROBILINOGEN 0.2  NITRITE NEGATIVE  LEUKOCYTESUR NEGATIVE    Lipid Panel:    Component Value Date/Time   CHOL  Value: 188         ATP III CLASSIFICATION:  <200     mg/dL   Desirable  629-528  mg/dL   Borderline High  >=413    mg/dL   High        10/20/4008 0634   TRIG 41 10/25/2010 0634   HDL 56 10/25/2010 0634   CHOLHDL 3.4 10/25/2010 0634   VLDL 8 10/25/2010 0634   LDLCALC  Value: 124        Total Cholesterol/HDL:CHD Risk Coronary Heart Disease Risk Table  Men   Women  1/2 Average Risk   3.4   3.3  Average Risk       5.0   4.4  2 X Average Risk   9.6   7.1  3 X Average Risk  23.4   11.0        Use the calculated Patient Ratio above and the CHD Risk Table to determine the patient's CHD Risk.        ATP III CLASSIFICATION (LDL):  <100     mg/dL   Optimal  409-811  mg/dL   Near or Above                    Optimal  130-159  mg/dL   Borderline  914-782  mg/dL   High  >956     mg/dL   Very High* 10/17/3084 5784    HgbA1C:  Lab Results  Component Value Date   HGBA1C 5.9* 01/16/2012    Urine Drug Screen:     Component Value Date/Time   LABOPIA NONE DETECTED 10/21/2009 1834   COCAINSCRNUR NONE DETECTED 10/21/2009 1834   LABBENZ POSITIVE* 10/21/2009 1834   AMPHETMU NONE DETECTED 10/21/2009 1834   THCU NONE DETECTED 10/21/2009 1834   LABBARB  Value: NONE DETECTED        DRUG SCREEN FOR MEDICAL PURPOSES ONLY.  IF CONFIRMATION IS NEEDED FOR ANY PURPOSE, NOTIFY LAB WITHIN 5 DAYS.        LOWEST DETECTABLE LIMITS FOR URINE DRUG SCREEN Drug Class       Cutoff (ng/mL) Amphetamine      1000 Barbiturate      200 Benzodiazepine   200 Tricyclics       300 Opiates          300 Cocaine          300 THC              50 10/21/2009 1834    Alcohol Level: No results found for this basename: ETH:2 in the last 168 hours   Imaging: Ct Head Wo Contrast  04/27/2012  *RADIOLOGY REPORT*  Clinical Data: Weakness  CT HEAD WITHOUT CONTRAST  Technique:  Contiguous axial images were obtained from the base of the skull through the vertex without contrast.  Comparison: 01/27/2012  Findings: There is no evidence for acute hemorrhage, hydrocephalus, mass lesion,  or abnormal extra-axial fluid collection.  No definite CT evidence for acute infarction.  Diffuse loss of parenchymal volume is consistent with atrophy. Patchy low attenuation in the deep hemispheric and periventricular white matter is nonspecific, but likely reflects chronic microvascular ischemic demyelination. Bilateral lacunar infarcts in the basal ganglia are chronic.  Polypoid mucosal thickening in the maxillary sinuses is stable.  As before, there is fluid in the right mastoid air cells.  IMPRESSION: Stable.  No acute intracranial abnormality.  Atrophy with chronic microvascular ischemic white matter demyelination.  Chronic lacunar infarcts within the basal ganglia bilaterally.  Original Report Authenticated By: ERIC A. MANSELL, M.D.    Assessment: 78 y.o. male  on Coumadin for DVT who presents with with transient left lower extremity weakness that was painless. There is most likely a TIA.  I do feel that this could represent a completed stroke with subsequent improvement given that he has a persistent left palmomental reflex though this could be associated with his memory problems. He also likely has a neuropathy, possibly from his previous alcohol use.   Stroke Risk Factors - family history and hypertension  Recommendations: 1. HgbA1c, fasting lipid panel, INR 2. MRI, MRA  of the brain without contrast 3. PT consult, OT consult, Speech consult if stroke is present on mri 4. Echocardiogram 5. Carotid dopplers 6. Prophylactic therapy-Anticoag med: Coumadin  7. Risk factor modification 8. Telemetry monitoring 9. Frequent neuro checks  Ritta Slot, MD [facility] [phone] 04/27/2012, 5:58 PM

## 2012-04-28 DIAGNOSIS — R269 Unspecified abnormalities of gait and mobility: Secondary | ICD-10-CM

## 2012-04-28 DIAGNOSIS — R5381 Other malaise: Secondary | ICD-10-CM

## 2012-04-28 LAB — BASIC METABOLIC PANEL
CO2: 22 mEq/L (ref 19–32)
Calcium: 8.5 mg/dL (ref 8.4–10.5)
Chloride: 108 mEq/L (ref 96–112)
GFR calc Af Amer: 38 mL/min — ABNORMAL LOW (ref >90)
Sodium: 138 mEq/L (ref 135–145)

## 2012-04-28 LAB — LIPID PANEL
HDL: 54 mg/dL (ref >39)
LDL Cholesterol: 37 mg/dL (ref 0–99)
Total CHOL/HDL Ratio: 2 RATIO
Triglycerides: 91 mg/dL (ref <150)

## 2012-04-28 LAB — HEMOGLOBIN A1C
Hgb A1c MFr Bld: 6 % — ABNORMAL HIGH (ref <5.7)
Mean Plasma Glucose: 126 mg/dL — ABNORMAL HIGH (ref <117)

## 2012-04-28 LAB — GLUCOSE, CAPILLARY
Glucose-Capillary: 78 mg/dL (ref 70–99)
Glucose-Capillary: 88 mg/dL (ref 70–99)

## 2012-04-28 MED ORDER — WARFARIN SODIUM 5 MG PO TABS
5.0000 mg | ORAL_TABLET | ORAL | Status: AC
  Administered 2012-04-28: 5 mg via ORAL
  Filled 2012-04-28: qty 1

## 2012-04-28 MED ORDER — TAMSULOSIN HCL 0.4 MG PO CAPS: 0.4000 mg | ORAL_CAPSULE | Freq: Every day | ORAL | Status: DC

## 2012-04-28 MED ORDER — WARFARIN SODIUM 2.5 MG PO TABS
2.5000 mg | ORAL_TABLET | Freq: Once | ORAL | Status: AC
  Administered 2012-04-28: 2.5 mg via ORAL
  Filled 2012-04-28: qty 1

## 2012-04-28 MED ORDER — WARFARIN - PHARMACIST DOSING INPATIENT: Freq: Every day | Status: DC

## 2012-04-28 NOTE — Discharge Summary (Signed)
. Physician Discharge Summary  Walter [name]:[id number] DOB: Dec 09, 1929 DOA: 04/27/2012  PCP: Eartha Inch, MD  Admit date: 04/27/2012 Discharge date: 04/28/2012  Recommendations for Outpatient Follow-up:  1. Patient needs INR in 3-5 days  Discharge Diagnoses:  Principal Problem:  *Weakness of left foot Active Problems:  HYPERLIPIDEMIA  HYPERTENSION  COPD-stable  Hyperkalemia  CKD (chronic kidney disease) stage 3, GFR 30-59 ml/min  ARF (acute renal failure)   Discharge Condition: Good  Diet recommendation: Continue same diet  Filed Weights   04/27/12 2034  Weight: 80.604 kg (177 lb 11.2 oz)    History of present illness:  78 yr old male with multiple co-morbidities presented to the ED 8/12 states that he was on his way to PT as an out-patient, and therapy called his PCP and his wife came over here with him. He states that his L foot was weak and he usually uses a walker. He states that his L foot was "dragging" He personally didn't realize it. No LOC, NO Slurred speech,just dragging of the L foot-PT noted that he was weaker than usual at out-patient PT and he was hence sent over here.  Wife concerned that he was going to have some sort of CVA. Neurology was consulted and recommended medical admission given abnormal palmomental reflex unilaterally.  Given that he had a score of 6 he was admitted and ultimately ruled out negative on MRI, carotid, echocardiogram for any specific abnormality. I did discuss his case with neurology PA who recommended no change in anticoagulation from Coumadin which is on for chronic DVT. He was cleared for discharge by them and was discharged back home, with no need for therapy input given he was ambulated and doing well on discharge    Discharge Exam: Filed Vitals:   04/28/12 1358  BP: 175/73  Pulse: 72  Temp: 97.8 F (36.6 C)  Resp: 18   Filed Vitals:   04/28/12 0738 04/28/12 1051 04/28/12 1058 04/28/12 1358  BP: 140/78  159/80  175/73  Pulse: 96  67 72  Temp:   98 F (36.7 C) 97.8 F (36.6 C)  TempSrc:   Oral Oral  Resp:   18 18  Height:      Weight:      SpO2:  97% 96% 96%   General: Very engaging pleasant Caucasian male looking about stated age Eyes: No pallor no icterus external ocular movements intact, vision by direct confrontation is normal ENT: Good dentition, uvula is midline tongue protrudes midline smile is symmetric facial folds are symmetric regarding of before it is symmetric jaw clenching his bilateral equally Neck: Soft and supple no JVD noted no bruits noted Cardiovascular: S1-S2 no murmur rub or gallop regular rate rhythm   Discharge Instructions  Discharge Orders    Future Appointments: Provider: Department: Dept Phone: Center:   04/30/2012 10:30 AM Berneice Heinrich, PT [facility] [phone] [facility]   05/04/2012 9:00 AM Nicki Reaper Feltis, PT [facility] [phone] [facility]   05/06/2012 11:15 AM Hortencia Conradi, PTA [facility] [phone] [facility]   05/12/2012 11:15 AM Berneice Heinrich, PT [facility] [phone] [facility]   05/14/2012 1:00 PM Berneice Heinrich, PT [facility] [phone] [facility]   05/25/2012 11:15 AM Berneice Heinrich, PT [facility] [phone] [facility]   05/28/2012 10:30 AM Berneice Heinrich, PT [facility] [phone] [facility]   06/01/2012 11:15 AM Berneice Heinrich, PT [facility] [phone] [facility]   06/04/2012 1:45 PM Berneice Heinrich, PT [facility] [phone] [facility]  Future Orders Please Complete By Expires   Diet - low sodium heart healthy      Increase activity slowly      Call MD for:      Call MD for:  severe uncontrolled pain      Call MD for:  redness, tenderness, or signs of infection (pain, swelling, redness, odor or green/yellow discharge around incision site)      Call MD for:  persistant dizziness or light-headedness      Call MD for:  extreme fatigue        Medication List  As of 04/28/2012  4:39 PM    STOP taking these medications         silodosin 8 MG Caps capsule         TAKE these medications         beta carotene w/minerals tablet   Take 2 tablets by mouth daily.      cholecalciferol 1000 UNITS tablet   Commonly known as: VITAMIN D   Take 1,000 Units by mouth daily.      gabapentin 100 MG capsule   Commonly known as: NEURONTIN   Take 200 mg by mouth at bedtime.      lisinopril 40 MG tablet   Commonly known as: PRINIVIL,ZESTRIL   Take 40 mg by mouth daily.      omeprazole 20 MG capsule   Commonly known as: PRILOSEC   Take 20 mg by mouth daily.      Tamsulosin HCl 0.4 MG Caps   Commonly known as: FLOMAX   Take 1 capsule (0.4 mg total) by mouth daily after supper.      tiotropium 18 MCG inhalation capsule   Commonly known as: SPIRIVA   Place 18 mcg into inhaler and inhale daily.      traZODone 50 MG tablet   Commonly known as: DESYREL   Take 50 mg by mouth at bedtime.      warfarin 5 MG tablet   Commonly known as: COUMADIN   Take 1 tablet (5 mg total) by mouth daily.              The results of significant diagnostics from this hospitalization (including imaging, microbiology, ancillary and laboratory) are listed below for reference.    Significant Diagnostic Studies: Ct Head Wo Contrast  04/27/2012  *RADIOLOGY REPORT*  Clinical Data: Weakness  CT HEAD WITHOUT CONTRAST  Technique:  Contiguous axial images were obtained from the base of the skull through the vertex without contrast.  Comparison: 01/27/2012  Findings: There is no evidence for acute hemorrhage, hydrocephalus, mass lesion, or abnormal extra-axial fluid collection.  No definite CT evidence for acute infarction.  Diffuse loss of parenchymal volume is consistent with atrophy. Patchy low attenuation in the deep hemispheric and periventricular white matter is nonspecific, but likely reflects chronic microvascular ischemic demyelination. Bilateral lacunar infarcts in the basal ganglia are chronic.   Polypoid mucosal thickening in the maxillary sinuses is stable.  As before, there is fluid in the right mastoid air cells.  IMPRESSION: Stable.  No acute intracranial abnormality.  Atrophy with chronic microvascular ischemic white matter demyelination.  Chronic lacunar infarcts within the basal ganglia bilaterally.  Original Report Authenticated By: ERIC A. MANSELL, M.D.   Mri Brain Without Contrast  04/28/2012  *RADIOLOGY REPORT*  Clinical Data:  Left foot weakness.  High blood pressure.  MRI BRAIN WITHOUT CONTRAST MRA HEAD WITHOUT CONTRAST  Technique: Multiplanar, multiecho pulse sequences of the brain and surrounding structures  were obtained according to standard protocol without intravenous contrast.  Angiographic images of the head were obtained using MRA technique without contrast.  Comparison: 04/16/2012 head CT.  10/25/2010 brain MRI and MRA angiogram.  MRI HEAD  Findings:  No acute infarct.  No intracranial hemorrhage.  No intracranial mass lesion detected on this unenhanced exam.  Global atrophy.  Ventricular prominence unchanged probably related to atrophy  Remote right thalamic, basal ganglia and left paracentral pontine infarct.  Prominent small vessel disease type changes.  Polypoid opacification maxillary sinuses.  Minimal to mild opacification ethmoid sinus air cells.  Partial opacification mastoid air cells more notable on the right.  Mild degenerative changes of the cervical spine.  IMPRESSION: No acute infarct.  Please see above.  MRA HEAD  Findings: Motion degraded exam.  Anterior circulation without large vessel stenosis/occlusion .  Mild irregularity pre cavernous segment of the internal carotid artery bilaterally.  Mild narrowing irregularity A1 segment and A2 segment anterior cerebral artery bilaterally greater on the left.  Minimal to mild narrowing M1 segment middle cerebral artery greater on the right.  Middle cerebral artery branch vessel irregularity bilaterally with decreased number  of visualized right middle cerebral artery branches.  Left vertebral artery is dominant.  Mild narrowing distal left vertebral artery.  Irregularity and narrowing of portions of the PICA bilaterally.  Mild irregularity basilar artery without high-grade stenosis.  Nonvisualization AICAs.  Marked narrowing and irregularity superior cerebellar arteries bilaterally.  Moderate to marked focal stenosis proximal posterior cerebral artery greater on the right.  Decreased visualization of the right posterior cerebral artery branch vessels.  No aneurysm noted.  IMPRESSION: Intracranial atherosclerotic type changes as noted above most notable involving branch vessels.  Preliminary report at the time of imaging by Dr. Benard Rink.  Original Report Authenticated By: Fuller Canada, M.D.   Mr Mra Head/brain Wo Cm  04/28/2012  *RADIOLOGY REPORT*  Clinical Data:  Left foot weakness.  High blood pressure.  MRI BRAIN WITHOUT CONTRAST MRA HEAD WITHOUT CONTRAST  Technique: Multiplanar, multiecho pulse sequences of the brain and surrounding structures were obtained according to standard protocol without intravenous contrast.  Angiographic images of the head were obtained using MRA technique without contrast.  Comparison: 04/16/2012 head CT.  10/25/2010 brain MRI and MRA angiogram.  MRI HEAD  Findings:  No acute infarct.  No intracranial hemorrhage.  No intracranial mass lesion detected on this unenhanced exam.  Global atrophy.  Ventricular prominence unchanged probably related to atrophy  Remote right thalamic, basal ganglia and left paracentral pontine infarct.  Prominent small vessel disease type changes.  Polypoid opacification maxillary sinuses.  Minimal to mild opacification ethmoid sinus air cells.  Partial opacification mastoid air cells more notable on the right.  Mild degenerative changes of the cervical spine.  IMPRESSION: No acute infarct.  Please see above.  MRA HEAD  Findings: Motion degraded exam.  Anterior circulation  without large vessel stenosis/occlusion .  Mild irregularity pre cavernous segment of the internal carotid artery bilaterally.  Mild narrowing irregularity A1 segment and A2 segment anterior cerebral artery bilaterally greater on the left.  Minimal to mild narrowing M1 segment middle cerebral artery greater on the right.  Middle cerebral artery branch vessel irregularity bilaterally with decreased number of visualized right middle cerebral artery branches.  Left vertebral artery is dominant.  Mild narrowing distal left vertebral artery.  Irregularity and narrowing of portions of the PICA bilaterally.  Mild irregularity basilar artery without high-grade stenosis.  Nonvisualization AICAs.  Marked narrowing and  irregularity superior cerebellar arteries bilaterally.  Moderate to marked focal stenosis proximal posterior cerebral artery greater on the right.  Decreased visualization of the right posterior cerebral artery branch vessels.  No aneurysm noted.  IMPRESSION: Intracranial atherosclerotic type changes as noted above most notable involving branch vessels.  Preliminary report at the time of imaging by Dr. Benard Rink.  Original Report Authenticated By: Fuller Canada, M.D.    Microbiology: No results found for this or any previous visit (from the past 240 hour(s)).   Labs: Basic Metabolic Panel:  Lab 04/28/12 1610 04/27/12 1449  NA 138 138  K 5.0 5.6*  CL 108 105  CO2 22 25  GLUCOSE 79 94  BUN 31* 30*  CREATININE 1.85* 2.08*  CALCIUM 8.5 8.9  MG -- --  PHOS -- --   Liver Function Tests: No results found for this basename: AST:5,ALT:5,ALKPHOS:5,BILITOT:5,PROT:5,ALBUMIN:5 in the last 168 hours No results found for this basename: LIPASE:5,AMYLASE:5 in the last 168 hours No results found for this basename: AMMONIA:5 in the last 168 hours CBC:  Lab 04/27/12 1449  WBC 8.4  NEUTROABS 6.5  HGB 11.9*  HCT 36.3*  MCV 92.1  PLT 310   Cardiac Enzymes:  Lab 04/27/12 1449  CKTOTAL --  CKMB --    CKMBINDEX --  TROPONINI <0.30   BNP: BNP (last 3 results) No results found for this basename: PROBNP:3 in the last 8760 hours CBG:  Lab 04/28/12 1227 04/28/12 0746 04/27/12 2252  GLUCAP 88 78 82    Time coordinating discharge: 20 minutes  Signed:  Rhetta Mura  [facility] 04/28/2012, 4:39 PM

## 2012-04-28 NOTE — Progress Notes (Signed)
ANTICOAGULATION CONSULT NOTE - Follow Up Consult  Pharmacy Consult for Coumadin Indication: History of DVT  No Known Allergies  Patient Measurements: Height: 5\' 11"  (180.3 cm) Weight: 177 lb 11.2 oz (80.604 kg) IBW/kg (Calculated) : 75.3    Vital Signs: Temp: 97.4 F (36.3 C) (08/13 0605) Temp src: Oral (08/13 0605) BP: 140/78 mmHg (08/13 0738) Pulse Rate: 96  (08/13 0738)  Labs:  Basename 04/28/12 0430 04/27/12 2300 04/27/12 1449  HGB -- -- 11.9*  HCT -- -- 36.3*  PLT -- -- 310  APTT -- -- --  LABPROT 28.4* 26.0* --  INR 2.62* 2.34* --  HEPARINUNFRC -- -- --  CREATININE -- -- 2.08*  CKTOTAL -- -- --  CKMB -- -- --  TROPONINI -- -- <0.30    Estimated Creatinine Clearance: 30.7 ml/min (by C-G formula based on Cr of 2.08).   Medications:  Scheduled:    . sodium chloride   Intravenous Once  . sodium chloride   Intravenous Once  . aspirin  325 mg Oral Daily  . beta carotene w/minerals  2 tablet Oral Daily  . cholecalciferol  1,000 Units Oral Daily  . gabapentin  200 mg Oral QHS  . hydrALAZINE  10 mg Oral Q6H  . LORazepam  1 mg Intravenous Once  . sodium polystyrene  15 g Oral Once  . Tamsulosin HCl  0.4 mg Oral QPC supper  . tiotropium  18 mcg Inhalation Daily  . traZODone  50 mg Oral QHS  . warfarin  5 mg Oral NOW  . Warfarin - Pharmacist Dosing Inpatient   Does not apply q1800  . DISCONTD: warfarin  5 mg Oral Daily  . DISCONTD: Warfarin - Physician Dosing Inpatient   Does not apply q1800    Assessment:  78 year-old male admitted 8/11 with left leg weakness, possible TIA  MRI without acute infarct or hemorrhage  Chronic Coumadin for history of DVT  Dose is documented as 5 mg daily  INR therapeutic this am, no bleeding/complications reported.   Goal of Therapy:  INR 2-3   Plan:   Coumadin 2.5mg  today  Daily PT/INR  Loralee Pacas, PharmD, BCPS Pager: [phone] 04/28/2012,8:40 AM

## 2012-04-28 NOTE — Progress Notes (Signed)
  Echocardiogram 2D Echocardiogram has been performed.  Cathie Beams 04/28/2012, 9:17 AM

## 2012-04-28 NOTE — Progress Notes (Signed)
VASCULAR LAB PRELIMINARY  PRELIMINARY  PRELIMINARY  PRELIMINARY  Carotid duplex  completed.    Preliminary report:  Bilateral:  No evidence of hemodynamically significant internal carotid artery stenosis.   Vertebral artery flow is antegrade.      Thelda Gagan, RVT 04/28/2012, 9:51 AM

## 2012-04-30 ENCOUNTER — Ambulatory Visit: Payer: Medicare Other | Admitting: Rehabilitative and Restorative Service Providers"

## 2012-05-04 ENCOUNTER — Ambulatory Visit: Payer: Medicare Other | Admitting: Physical Therapy

## 2012-05-06 ENCOUNTER — Ambulatory Visit: Payer: Medicare Other | Admitting: Physical Therapy

## 2012-05-11 ENCOUNTER — Ambulatory Visit: Payer: Medicare Other | Admitting: Physical Therapy

## 2012-05-12 ENCOUNTER — Encounter: Payer: Medicare Other | Admitting: Rehabilitative and Restorative Service Providers"

## 2012-05-14 ENCOUNTER — Ambulatory Visit: Payer: Medicare Other | Admitting: Rehabilitative and Restorative Service Providers"

## 2012-05-25 ENCOUNTER — Ambulatory Visit: Payer: Medicare Other | Attending: Family Medicine | Admitting: Rehabilitative and Restorative Service Providers"

## 2012-05-25 DIAGNOSIS — M6281 Muscle weakness (generalized): Secondary | ICD-10-CM | POA: Insufficient documentation

## 2012-05-25 DIAGNOSIS — R269 Unspecified abnormalities of gait and mobility: Secondary | ICD-10-CM | POA: Insufficient documentation

## 2012-05-25 DIAGNOSIS — IMO0001 Reserved for inherently not codable concepts without codable children: Secondary | ICD-10-CM | POA: Insufficient documentation

## 2012-05-28 ENCOUNTER — Ambulatory Visit: Payer: Medicare Other | Admitting: Rehabilitative and Restorative Service Providers"

## 2012-06-01 ENCOUNTER — Ambulatory Visit: Payer: Medicare Other | Admitting: Rehabilitative and Restorative Service Providers"

## 2012-06-04 ENCOUNTER — Encounter: Payer: Medicare Other | Admitting: Rehabilitative and Restorative Service Providers"

## 2013-02-12 ENCOUNTER — Encounter (HOSPITAL_COMMUNITY): Payer: Self-pay

## 2013-02-12 ENCOUNTER — Inpatient Hospital Stay (HOSPITAL_COMMUNITY)
Admission: EM | Admit: 2013-02-12 | Discharge: 2013-02-14 | DRG: 726 | Disposition: A | Discharge status: Home / Self Care | Payer: Medicare Other | Attending: Internal Medicine | Admitting: Internal Medicine

## 2013-02-12 DIAGNOSIS — I129 Hypertensive chronic kidney disease with stage 1 through stage 4 chronic kidney disease, or unspecified chronic kidney disease: Secondary | ICD-10-CM | POA: Diagnosis present

## 2013-02-12 DIAGNOSIS — J4489 Other specified chronic obstructive pulmonary disease: Secondary | ICD-10-CM | POA: Diagnosis present

## 2013-02-12 DIAGNOSIS — J441 Chronic obstructive pulmonary disease with (acute) exacerbation: Secondary | ICD-10-CM

## 2013-02-12 DIAGNOSIS — R739 Hyperglycemia, unspecified: Secondary | ICD-10-CM

## 2013-02-12 DIAGNOSIS — N189 Chronic kidney disease, unspecified: Secondary | ICD-10-CM | POA: Diagnosis present

## 2013-02-12 DIAGNOSIS — E875 Hyperkalemia: Secondary | ICD-10-CM

## 2013-02-12 DIAGNOSIS — Z79899 Other long term (current) drug therapy: Secondary | ICD-10-CM

## 2013-02-12 DIAGNOSIS — I10 Essential (primary) hypertension: Secondary | ICD-10-CM

## 2013-02-12 DIAGNOSIS — R531 Weakness: Secondary | ICD-10-CM

## 2013-02-12 DIAGNOSIS — K219 Gastro-esophageal reflux disease without esophagitis: Secondary | ICD-10-CM | POA: Diagnosis present

## 2013-02-12 DIAGNOSIS — W19XXXS Unspecified fall, sequela: Secondary | ICD-10-CM

## 2013-02-12 DIAGNOSIS — J189 Pneumonia, unspecified organism: Secondary | ICD-10-CM

## 2013-02-12 DIAGNOSIS — D72829 Elevated white blood cell count, unspecified: Secondary | ICD-10-CM

## 2013-02-12 DIAGNOSIS — N401 Enlarged prostate with lower urinary tract symptoms: Principal | ICD-10-CM | POA: Diagnosis present

## 2013-02-12 DIAGNOSIS — J449 Chronic obstructive pulmonary disease, unspecified: Secondary | ICD-10-CM | POA: Diagnosis present

## 2013-02-12 DIAGNOSIS — N179 Acute kidney failure, unspecified: Secondary | ICD-10-CM | POA: Diagnosis present

## 2013-02-12 DIAGNOSIS — J438 Other emphysema: Secondary | ICD-10-CM

## 2013-02-12 DIAGNOSIS — Z87891 Personal history of nicotine dependence: Secondary | ICD-10-CM

## 2013-02-12 DIAGNOSIS — N138 Other obstructive and reflux uropathy: Principal | ICD-10-CM | POA: Diagnosis present

## 2013-02-12 DIAGNOSIS — R339 Retention of urine, unspecified: Secondary | ICD-10-CM | POA: Diagnosis present

## 2013-02-12 DIAGNOSIS — E785 Hyperlipidemia, unspecified: Secondary | ICD-10-CM

## 2013-02-12 LAB — BASIC METABOLIC PANEL
BUN: 47 mg/dL — ABNORMAL HIGH (ref 6–23)
CO2: 22 mEq/L (ref 19–32)
Calcium: 9 mg/dL (ref 8.4–10.5)
Creatinine, Ser: 2.66 mg/dL — ABNORMAL HIGH (ref 0.50–1.35)

## 2013-02-12 LAB — CBC WITH DIFFERENTIAL/PLATELET
Basophils Absolute: 0 10*3/uL (ref 0.0–0.1)
Basophils Relative: 0 % (ref 0–1)
Eosinophils Relative: 1 % (ref 0–5)
HCT: 32 % — ABNORMAL LOW (ref 39.0–52.0)
Lymphocytes Relative: 11 % — ABNORMAL LOW (ref 12–46)
MCHC: 32.8 g/dL (ref 30.0–36.0)
MCV: 88.4 fL (ref 78.0–100.0)
Monocytes Absolute: 1.3 10*3/uL — ABNORMAL HIGH (ref 0.1–1.0)
RDW: 13.8 % (ref 11.5–15.5)

## 2013-02-12 LAB — URINE MICROSCOPIC-ADD ON

## 2013-02-12 LAB — URINALYSIS, ROUTINE W REFLEX MICROSCOPIC
Glucose, UA: NEGATIVE mg/dL
Hgb urine dipstick: NEGATIVE
Ketones, ur: NEGATIVE mg/dL
Protein, ur: 30 mg/dL — AB

## 2013-02-12 MED ORDER — SODIUM CHLORIDE 0.9 % IV BOLUS (SEPSIS)
1000.0000 mL | Freq: Once | INTRAVENOUS | Status: AC
  Administered 2013-02-12: 1000 mL via INTRAVENOUS

## 2013-02-12 MED ORDER — ASPIRIN 81 MG PO CHEW
81.0000 mg | CHEWABLE_TABLET | Freq: Once | ORAL | Status: AC
  Administered 2013-02-12: 81 mg via ORAL
  Filled 2013-02-12: qty 1

## 2013-02-12 NOTE — ED Notes (Signed)
Pt states he has not taken Coumadin in almost 1 year. Dr. Silverio Lay notified will cancel PT

## 2013-02-12 NOTE — ED Notes (Signed)
[id number]<XB> Expected date:<BR> Expected time:<BR> Means of arrival:<BR> Comments:<BR> HOld for TR 1

## 2013-02-12 NOTE — ED Provider Notes (Signed)
History     CSN: [id number]  Arrival date & time 02/12/13  1933   First MD Initiated Contact with Patient 02/12/13 2005      Chief Complaint  Patient presents with  . Urinary Retention    (Consider location/radiation/quality/duration/timing/severity/associated sxs/prior treatment) The history is provided by the patient.  Walter [name] is a 79 y.o. male hx of COPD, DVT on coumadin, BPH here with urinary retention. He's been having worsening renal function recently. He also has been dribbling more especially at night. This morning he is unable to urinate at all and had lower abdominal pain. Denies any nausea vomiting. Denies any fevers.    Past Medical History  Diagnosis Date  . COPD (chronic obstructive pulmonary disease)   . Renal disorder   . Hypertension   . BPH (benign prostatic hyperplasia)   . GERD (gastroesophageal reflux disease)   . Neuropathy     Past Surgical History  Procedure Laterality Date  . Tonsillectomy    . Appendectomy    . Hemorrhoid surgery    . Back surgery      Family History  Problem Relation Age of Onset  . Alcohol abuse Father   . Heart failure Father     History  Substance Use Topics  . Smoking status: Former Smoker -- 50 years    Quit date: 04/27/2000  . Smokeless tobacco: Never Used  . Alcohol Use: No      Review of Systems  Gastrointestinal: Positive for abdominal pain.  Genitourinary: Positive for difficulty urinating.  All other systems reviewed and are negative.    Allergies  Review of patient's allergies indicates no known allergies.  Home Medications   Current Outpatient Rx  Name  Route  Sig  Dispense  Refill  . beta carotene w/minerals (OCUVITE) tablet   Oral   Take 2 tablets by mouth daily.         . cholecalciferol (VITAMIN D) 1000 UNITS tablet   Oral   Take 1,000 Units by mouth daily.         Marland Kitchen gabapentin (NEURONTIN) 100 MG capsule   Oral   Take 200 mg by mouth at bedtime.          Marland Kitchen  lisinopril (PRINIVIL,ZESTRIL) 40 MG tablet   Oral   Take 40 mg by mouth daily.         Marland Kitchen omeprazole (PRILOSEC) 20 MG capsule   Oral   Take 20 mg by mouth daily.         . Tamsulosin HCl (FLOMAX) 0.4 MG CAPS   Oral   Take 1 capsule (0.4 mg total) by mouth daily after supper.   30 capsule   0   . tiotropium (SPIRIVA) 18 MCG inhalation capsule   Inhalation   Place 18 mcg into inhaler and inhale daily.         . traZODone (DESYREL) 50 MG tablet   Oral   Take 50 mg by mouth at bedtime.         Marland Kitchen EXPIRED: warfarin (COUMADIN) 5 MG tablet   Oral   Take 1 tablet (5 mg total) by mouth daily.   10 tablet   0     BP 171/60  Pulse 75  Temp(Src) 97.9 F (36.6 C) (Oral)  Resp 20  SpO2 98%  Physical Exam  Nursing note and vitals reviewed. Constitutional: He is oriented to person, place, and time. He appears well-developed and well-nourished.  Uncomfortable   HENT:  Head: Normocephalic.  Mouth/Throat: Oropharynx is clear and moist.  Eyes: Conjunctivae are normal. Pupils are equal, round, and reactive to light.  Neck: Normal range of motion. Neck supple.  Cardiovascular: Normal rate, regular rhythm and normal heart sounds.   Pulmonary/Chest: Effort normal and breath sounds normal. No respiratory distress. He has no wheezes. He has no rales.  Abdominal: Soft.  + distended bladder, + tender over this area   Musculoskeletal: Normal range of motion.  Neurological: He is alert and oriented to person, place, and time.  Skin: Skin is warm and dry.  Psychiatric: He has a normal mood and affect. His behavior is normal. Judgment and thought content normal.    ED Course  Procedures (including critical care time)  Labs Reviewed  URINALYSIS, ROUTINE W REFLEX MICROSCOPIC - Abnormal; Notable for the following:    Protein, ur 30 (*)    All other components within normal limits  CBC WITH DIFFERENTIAL - Abnormal; Notable for the following:    WBC 11.6 (*)    RBC 3.62 (*)     Hemoglobin 10.5 (*)    HCT 32.0 (*)    Neutro Abs 8.9 (*)    Lymphocytes Relative 11 (*)    Monocytes Absolute 1.3 (*)    All other components within normal limits  BASIC METABOLIC PANEL - Abnormal; Notable for the following:    Potassium 5.4 (*)    BUN 47 (*)    Creatinine, Ser 2.66 (*)    GFR calc non Af Amer 21 (*)    GFR calc Af Amer 24 (*)    All other components within normal limits  URINE CULTURE  URINE MICROSCOPIC-ADD ON   No results found.   No diagnosis found.    MDM  [name] is a 79 y.o. male here with urinary retention. Foley placed with 900cc drainage. Will check kidney function and reassess.   10:34 PM Cr 2.66, much elevated compared to baseline. UA nl. He drained out another 500 cc for the last 1.5 hrs. He now feels more dehydrated. Will give IVF and admit for possible post obstructive diuresis. I discussed with Dr. Susie Cassette, who accepted the patient.         Richardean Canal, MD 02/12/13 2259

## 2013-02-12 NOTE — ED Notes (Signed)
Pt tolerated cath well, immediate relief. Wife at bedside.

## 2013-02-12 NOTE — ED Notes (Signed)
Pt presents to ER with c/o urinary retention. Pt has a hx of decreased kidney function and began having urinary retention this morning. Pt has not been able to urinate since this morning. Pt says he has been barely "dribbling" but not able to urinate otherwise. No back pain and no abdominal pain.

## 2013-02-13 ENCOUNTER — Encounter (HOSPITAL_COMMUNITY): Payer: Self-pay

## 2013-02-13 DIAGNOSIS — N189 Chronic kidney disease, unspecified: Secondary | ICD-10-CM

## 2013-02-13 DIAGNOSIS — N179 Acute kidney failure, unspecified: Secondary | ICD-10-CM

## 2013-02-13 LAB — CBC
HCT: 29.6 % — ABNORMAL LOW (ref 39.0–52.0)
Platelets: 325 10*3/uL (ref 150–400)
RDW: 13.8 % (ref 11.5–15.5)
WBC: 9 10*3/uL (ref 4.0–10.5)

## 2013-02-13 LAB — COMPREHENSIVE METABOLIC PANEL
Alkaline Phosphatase: 51 U/L (ref 39–117)
BUN: 43 mg/dL — ABNORMAL HIGH (ref 6–23)
Chloride: 107 mEq/L (ref 96–112)
Creatinine, Ser: 2.45 mg/dL — ABNORMAL HIGH (ref 0.50–1.35)
GFR calc Af Amer: 27 mL/min — ABNORMAL LOW (ref >90)
Glucose, Bld: 100 mg/dL — ABNORMAL HIGH (ref 70–99)
Potassium: 4.5 mEq/L (ref 3.5–5.1)
Total Bilirubin: 0.2 mg/dL — ABNORMAL LOW (ref 0.3–1.2)
Total Protein: 5.9 g/dL — ABNORMAL LOW (ref 6.0–8.3)

## 2013-02-13 MED ORDER — ATORVASTATIN CALCIUM 40 MG PO TABS
40.0000 mg | ORAL_TABLET | Freq: Every evening | ORAL | Status: DC
  Administered 2013-02-13: 40 mg via ORAL
  Filled 2013-02-13 (×2): qty 1

## 2013-02-13 MED ORDER — ACETAMINOPHEN 325 MG PO TABS
650.0000 mg | ORAL_TABLET | Freq: Four times a day (QID) | ORAL | Status: DC | PRN
  Administered 2013-02-13 (×2): 650 mg via ORAL
  Filled 2013-02-13 (×2): qty 2

## 2013-02-13 MED ORDER — ENOXAPARIN SODIUM 40 MG/0.4ML ~~LOC~~ SOLN
40.0000 mg | SUBCUTANEOUS | Status: DC
  Administered 2013-02-13: 40 mg via SUBCUTANEOUS
  Filled 2013-02-13 (×2): qty 0.4

## 2013-02-13 MED ORDER — ACETAMINOPHEN 650 MG RE SUPP: 650.0000 mg | Freq: Four times a day (QID) | RECTAL | Status: DC | PRN

## 2013-02-13 MED ORDER — OXYBUTYNIN CHLORIDE 5 MG PO TABS
5.0000 mg | ORAL_TABLET | Freq: Two times a day (BID) | ORAL | Status: DC
  Administered 2013-02-13 – 2013-02-14 (×4): 5 mg via ORAL
  Filled 2013-02-13 (×6): qty 1

## 2013-02-13 MED ORDER — GABAPENTIN 100 MG PO CAPS
100.0000 mg | ORAL_CAPSULE | Freq: Two times a day (BID) | ORAL | Status: DC
  Administered 2013-02-13 – 2013-02-14 (×4): 100 mg via ORAL
  Filled 2013-02-13 (×6): qty 1

## 2013-02-13 MED ORDER — TAMSULOSIN HCL 0.4 MG PO CAPS
0.4000 mg | ORAL_CAPSULE | Freq: Every day | ORAL | Status: DC
  Administered 2013-02-13: 0.4 mg via ORAL
  Filled 2013-02-13 (×2): qty 1

## 2013-02-13 MED ORDER — TIOTROPIUM BROMIDE MONOHYDRATE 18 MCG IN CAPS
18.0000 ug | ORAL_CAPSULE | Freq: Every evening | RESPIRATORY_TRACT | Status: DC
  Administered 2013-02-13: 18 ug via RESPIRATORY_TRACT
  Filled 2013-02-13: qty 5

## 2013-02-13 MED ORDER — ALBUTEROL SULFATE HFA 108 (90 BASE) MCG/ACT IN AERS
2.0000 | INHALATION_SPRAY | Freq: Four times a day (QID) | RESPIRATORY_TRACT | Status: DC | PRN
  Filled 2013-02-13: qty 6.7

## 2013-02-13 MED ORDER — TRAZODONE HCL 50 MG PO TABS
50.0000 mg | ORAL_TABLET | Freq: Every day | ORAL | Status: DC
  Administered 2013-02-13 (×2): 50 mg via ORAL
  Filled 2013-02-13 (×4): qty 1

## 2013-02-13 MED ORDER — ONDANSETRON HCL 4 MG/2ML IJ SOLN: 4.0000 mg | Freq: Four times a day (QID) | INTRAMUSCULAR | Status: DC | PRN

## 2013-02-13 MED ORDER — SODIUM CHLORIDE 0.9 % IV SOLN
INTRAVENOUS | Status: DC
  Administered 2013-02-13: 02:00:00 via INTRAVENOUS

## 2013-02-13 MED ORDER — ALPRAZOLAM 0.5 MG PO TABS
0.5000 mg | ORAL_TABLET | Freq: Every evening | ORAL | Status: DC | PRN
  Administered 2013-02-13: 0.5 mg via ORAL
  Filled 2013-02-13: qty 1

## 2013-02-13 MED ORDER — VITAMINS A & D EX OINT
TOPICAL_OINTMENT | CUTANEOUS | Status: AC
  Administered 2013-02-13: 21:00:00
  Filled 2013-02-13: qty 5

## 2013-02-13 MED ORDER — ONDANSETRON HCL 4 MG PO TABS: 4.0000 mg | ORAL_TABLET | Freq: Four times a day (QID) | ORAL | Status: DC | PRN

## 2013-02-13 NOTE — H&P (Signed)
[facility] History and Physical  Walter [name] ZOX:[id number] DOB: 05/31/1930 DOA: 02/12/2013  Referring physician:  ED  PCP: Eartha Inch, MD   Chief Complaint: *Urinary Retention    HPI:  79 yr old  presents to ER with c/o urinary retention. Pt has a hx of decreased kidney function and began having urinary retention this morning. Pt has not been able to urinate since this morning. Pt says he has been barely "dribbling" but not able to urinate otherwise. No back pain and no abdominal pain. Patient denies any fever chills or rigors. Baseline creatinine of around 1.8. Creatinine was 2.66 today       Review of Systems: negative for the following  Constitutional: Denies fever, chills, diaphoresis, appetite change and fatigue.  HEENT: Denies photophobia, eye pain, redness, hearing loss, ear pain, congestion, sore throat, rhinorrhea, sneezing, mouth sores, trouble swallowing, neck pain, neck stiffness and tinnitus.  Respiratory: Denies SOB, DOE, cough, chest tightness, and wheezing.  Cardiovascular: Denies chest pain, palpitations and leg swelling.  Gastrointestinal: Positive for abdominal pain.  Genitourinary: Positive for difficulty urinating  ]Musculoskeletal: Denies myalgias, back pain, joint swelling, arthralgias and gait problem.  Skin: Denies pallor, rash and wound.  Neurological: Denies dizziness, seizures, syncope, weakness, light-headedness, numbness and headaches.  Hematological: Denies adenopathy. Easy bruising, personal or family bleeding history  Psychiatric/Behavioral: Denies suicidal ideation, mood changes, confusion, nervousness, sleep disturbance and agitation       Past Medical History  Diagnosis Date  . COPD (chronic obstructive pulmonary disease)   . Renal disorder   . Hypertension   . BPH (benign prostatic hyperplasia)   . GERD (gastroesophageal reflux disease)   . Neuropathy      Past Surgical History  Procedure Laterality Date  .  Tonsillectomy    . Appendectomy    . Hemorrhoid surgery    . Back surgery        Social History:  reports that he quit smoking about 12 years ago. He has never used smokeless tobacco. He reports that he does not drink alcohol or use illicit drugs.    No Known Allergies  Family History  Problem Relation Age of Onset  . Alcohol abuse Father   . Heart failure Father      Prior to Admission medications   Medication Sig Start Date End Date Taking? Authorizing Provider  albuterol (PROVENTIL HFA;VENTOLIN HFA) 108 (90 BASE) MCG/ACT inhaler Inhale 2 puffs into the lungs every 6 (six) hours as needed for wheezing or shortness of breath.   Yes Historical Provider, MD  ALPRAZolam Prudy Feeler) 0.5 MG tablet Take 0.5 mg by mouth at bedtime as needed for sleep.   Yes Historical Provider, MD  atorvastatin (LIPITOR) 40 MG tablet Take 40 mg by mouth every evening.   Yes Historical Provider, MD  cholecalciferol (VITAMIN D) 1000 UNITS tablet Take 1,000 Units by mouth every evening.    Yes Historical Provider, MD  gabapentin (NEURONTIN) 100 MG capsule Take 100 mg by mouth 2 (two) times daily.    Yes Historical Provider, MD  lisinopril (PRINIVIL,ZESTRIL) 40 MG tablet Take 40 mg by mouth every evening.    Yes Historical Provider, MD  Multiple Vitamins-Minerals (OCUVITE PRESERVISION PO) Take 2 tablets by mouth every evening.    Yes Historical Provider, MD  ranitidine (ZANTAC) 300 MG tablet Take 300 mg by mouth at bedtime.   Yes Historical Provider, MD  tiotropium (SPIRIVA) 18 MCG inhalation capsule Place 18 mcg into inhaler and inhale every evening.  Yes Historical Provider, MD  traZODone (DESYREL) 50 MG tablet Take 50 mg by mouth at bedtime.   Yes Historical Provider, MD  oxybutynin (DITROPAN) 5 MG tablet Take 5 mg by mouth 2 (two) times daily.    Historical Provider, MD     Physical Exam: Filed Vitals:   02/12/13 1940  BP: 171/60  Pulse: 75  Temp: 97.9 F (36.6 C)  TempSrc: Oral  Resp: 20  SpO2:  98%     Constitutional: Vital signs reviewed. Patient is a well-developed and well-nourished in no acute distress and cooperative with exam. Alert and oriented x3.  Head: Normocephalic and atraumatic  Ear: TM normal bilaterally  Mouth: no erythema or exudates, MMM  Eyes: PERRL, EOMI, conjunctivae normal, No scleral icterus.  Neck: Supple, Trachea midline normal ROM, No JVD, mass, thyromegaly, or carotid bruit present.  Cardiovascular: RRR, S1 normal, S2 normal, no MRG, pulses symmetric and intact bilaterally  Pulmonary/Chest: CTAB, no wheezes, rales, or rhonchi  Abdominal: Soft. Non-tender, non-distended, bowel sounds are normal, no masses, organomegaly, or guarding present.  GU: no CVA tenderness Musculoskeletal: No joint deformities, erythema, or stiffness, ROM full and no nontender Ext: no edema and no cyanosis, pulses palpable bilaterally (DP and PT)  Hematology: no cervical, inginal, or axillary adenopathy.  Neurological: A&O x3, Strenght is normal and symmetric bilaterally, cranial nerve II-XII are grossly intact, no focal motor deficit, sensory intact to light touch bilaterally.  Skin: Warm, dry and intact. No rash, cyanosis, or clubbing.  Psychiatric: Normal mood and affect. speech and behavior is normal. Judgment and thought content normal. Cognition and memory are normal.       Labs on Admission:    Basic Metabolic Panel:  Recent Labs Lab 02/12/13 2035  NA 136  K 5.4*  CL 104  CO2 22  GLUCOSE 93  BUN 47*  CREATININE 2.66*  CALCIUM 9.0   Liver Function Tests: No results found for this basename: AST, ALT, ALKPHOS, BILITOT, PROT, ALBUMIN,  in the last 168 hours No results found for this basename: LIPASE, AMYLASE,  in the last 168 hours No results found for this basename: AMMONIA,  in the last 168 hours CBC:  Recent Labs Lab 02/12/13 2035  WBC 11.6*  NEUTROABS 8.9*  HGB 10.5*  HCT 32.0*  MCV 88.4  PLT 381   Cardiac Enzymes: No results found for this  basename: CKTOTAL, CKMB, CKMBINDEX, TROPONINI,  in the last 168 hours  BNP (last 3 results) No results found for this basename: PROBNP,  in the last 8760 hours    CBG: No results found for this basename: GLUCAP,  in the last 168 hours  Radiological Exams on Admission: No results found.  EKG: Independently reviewed.   Assessment/Plan   Urinary retention-continue Foley catheter, patient has a urologist in [place]. He will start the patient on Flomax, urinalysis negative for infection. May need to discharge home with an indwelling Foley, and followup with the urologist   Acute renal failure-likely secondary to obstructive uropathy, hold ACE inhibitor, continue IV hydration   COPD-stable  Code Status:   full Family Communication: bedside Disposition Plan: admit   Time spent: 70 mins   [facility] Pager [phone]  If 7PM-7AM, please contact night-coverage [email or web address] Password [email or web address] 02/13/2013, 12:11 AM

## 2013-02-13 NOTE — Progress Notes (Signed)
Pt seen and examined at bedside. Vitals and blood work reviewed and at pt's baseline. Please see earlier note by Dr. Susie Cassette.  HPI/Subjective: No events overnight.   Objective: Filed Vitals:   02/12/13 1940 02/13/13 0135 02/13/13 0534  BP: 171/60 176/61 112/45  Pulse: 75 70 66  Temp: 97.9 F (36.6 C) 97.5 F (36.4 C) 98.1 F (36.7 C)  TempSrc: Oral Oral Oral  Resp: 20 19 16   Height:  5\' 11"  (1.803 m)   Weight:  74.6 kg (164 lb 7.4 oz)   SpO2: 98% 99% 97%    Intake/Output Summary (Last 24 hours) at 02/13/13 1204 Last data filed at 02/13/13 0900  Gross per 24 hour  Intake    580 ml  Output   3700 ml  Net  -3120 ml    Exam:   General:  Pt is alert, follows commands appropriately, not in acute distress  Cardiovascular: Regular rate and rhythm, S1/S2, no murmurs, no rubs, no gallops  Respiratory: Clear to auscultation bilaterally, no wheezing, no crackles, no rhonchi  Abdomen: Soft, non tender, non distended, bowel sounds present, no guarding  Extremities: No edema, pulses DP and PT palpable bilaterally  Neuro: Grossly nonfocal  Data Reviewed: Basic Metabolic Panel:  Recent Labs Lab 02/12/13 2035 02/13/13 0350  NA 136 136  K 5.4* 4.5  CL 104 107  CO2 22 19  GLUCOSE 93 100*  BUN 47* 43*  CREATININE 2.66* 2.45*  CALCIUM 9.0 8.4   Liver Function Tests:  Recent Labs Lab 02/13/13 0350  AST 20  ALT 17  ALKPHOS 51  BILITOT 0.2*  PROT 5.9*  ALBUMIN 3.1*   CBC:  Recent Labs Lab 02/12/13 2035 02/13/13 0350  WBC 11.6* 9.0  NEUTROABS 8.9*  --   HGB 10.5* 9.5*  HCT 32.0* 29.6*  MCV 88.4 88.6  PLT 381 325    Scheduled Meds: . atorvastatin  40 mg Oral QPM  . enoxaparin (LOVENOX) injection  40 mg Subcutaneous Q24H  . gabapentin  100 mg Oral BID  . oxybutynin  5 mg Oral BID  . tamsulosin  0.4 mg Oral QPC supper  . tiotropium  18 mcg Inhalation QPM  . traZODone  50 mg Oral QHS   Continuous Infusions: . sodium chloride 75 mL/hr at 02/13/13 0210     Debbora Presto, MD  [facility] Pager [phone]  If 7PM-7AM, please contact night-coverage [email or web address] Password [email or web address] 02/13/2013, 12:04 PM   LOS: 1 day

## 2013-02-14 DIAGNOSIS — J189 Pneumonia, unspecified organism: Secondary | ICD-10-CM

## 2013-02-14 LAB — CBC
Platelets: 324 10*3/uL (ref 150–400)
RBC: 3.3 MIL/uL — ABNORMAL LOW (ref 4.22–5.81)
RDW: 14 % (ref 11.5–15.5)
WBC: 7.8 10*3/uL (ref 4.0–10.5)

## 2013-02-14 LAB — BASIC METABOLIC PANEL
CO2: 22 mEq/L (ref 19–32)
Chloride: 112 mEq/L (ref 96–112)
GFR calc Af Amer: 30 mL/min — ABNORMAL LOW (ref >90)
Potassium: 4.9 mEq/L (ref 3.5–5.1)

## 2013-02-14 LAB — URINE CULTURE: Culture: NO GROWTH

## 2013-02-14 MED ORDER — TAMSULOSIN HCL 0.4 MG PO CAPS: 0.4000 mg | ORAL_CAPSULE | Freq: Every day | ORAL | Status: AC

## 2013-02-14 MED ORDER — ALPRAZOLAM 0.5 MG PO TABS: 0.5000 mg | ORAL_TABLET | Freq: Every evening | ORAL | Status: AC | PRN

## 2013-02-14 MED ORDER — HYDRALAZINE HCL 10 MG PO TABS: 10.0000 mg | ORAL_TABLET | Freq: Two times a day (BID) | ORAL | Status: AC

## 2013-02-14 NOTE — Care Management Note (Signed)
    Page 1 of 1   02/14/2013     10:14:36 AM   CARE MANAGEMENT NOTE 02/14/2013  Patient:  Walter [name], Walter [name]   Account Number:  [id number]  Date Initiated:  02/13/2013  Documentation initiated by:  [facility]  Subjective/Objective Assessment:   79 year old male admitted with inability to urinate and renal failure.     Action/Plan:   From home.   Anticipated DC Date:  02/14/2013   Anticipated DC Plan:  HOME W HOME HEALTH SERVICES      DC Planning Services  CM consult      Choice offered to / List presented to:  C-1 Patient        HH arranged  HH-1 RN  HH-2 PT      [facility] agency  [facility]   Status of service:  Completed, signed off Medicare Important Message given?   (If response is "NO", the following Medicare IM given date fields will be blank) Date Medicare IM given:   Date Additional Medicare IM given:    Discharge Disposition:  HOME W HOME HEALTH SERVICES  Per UR Regulation:  Reviewed for med. necessity/level of care/duration of stay  If discussed at Long Length of Stay Meetings, dates discussed:    Comments:  02/14/13 Madyn Ivins RN,BSN NCM WEEKEND [phone] PATIENT HAS RW.RECEIVED HH ORDERS FOR HHRN/PT.SPOKE TO PATIENT ABOUT HHC AGENCY.[facility] CHOSEN.TC [facility] [phone],SPOKE TO KRISTEN,INFORMED OF HH ORDERS, & D/C,THEY WILL F/U.

## 2013-02-14 NOTE — Discharge Summary (Signed)
Physician Discharge Summary  Walter [name] WJX:[id number] DOB: 18-Oct-1929 DOA: 02/12/2013  PCP: Eartha Inch, MD  Admit date: 02/12/2013 Discharge date: 02/14/2013  Recommendations for Outpatient Follow-up:  1. Pt will need to follow up with PCP in 2-3 weeks post discharge 2. Please obtain BMP to evaluate electrolytes and kidney function 3. Please also check CBC to evaluate Hg and Hct levels 4. Please note that pt was discharged with Foley cath and was advised to follow up with his urologist June 3rd, appointment scheduled and pt made aware, verbalized understanding 5. Please note that pt was told to hold off on taking Lisinopril due to kidney failure, he was started on Hydralazine and education about medication provided   Discharge Diagnoses:  Acute urinary retention   Discharge Condition: Stable  Diet recommendation: Heart healthy diet discussed in details   History of present illness:  79 yr old presents to ER with c/o urinary retention. Pt has a hx of decreased kidney function and began having urinary retention the morning of admission. Pt has not been able to urinate since this morning. Pt says he has been barely "dribbling" but not able to urinate otherwise. No back pain and no abdominal pain. Patient denies any fever chills or rigors. Baseline creatinine of around 1.8. Creatinine was 2.66 today   Hospital Course:  Acute urinary retention - likely secondary to BPH, foley placed and pt tolerated well - pt was discharged on Foley and was educated on importance of follow up with his urologist - pt had BMP checked this AM and creatinine trending down Acute on chronic renal failure - partly exacerbated by acute urinary retention - creatinine trending down - lisinopril discontinued and hydralazine started - will ask PCP to address   Procedures/Studies:  None  Consultations:  None  Antibiotics:  None  Discharge Exam: Filed Vitals:   02/14/13 0444  BP: 119/60   Pulse: 60  Temp: 98.2 F (36.8 C)  Resp: 20   Filed Vitals:   02/13/13 1310 02/13/13 1754 02/13/13 2204 02/14/13 0444  BP: 138/59  131/48 119/60  Pulse: 62  58 60  Temp: 98.2 F (36.8 C)  97.6 F (36.4 C) 98.2 F (36.8 C)  TempSrc: Oral  Oral Oral  Resp: 18  20 20   Height:      Weight:      SpO2: 98% 98% 97% 97%    General: Pt is alert, follows commands appropriately, not in acute distress Cardiovascular: Regular rate and rhythm, S1/S2 +, no murmurs, no rubs, no gallops Respiratory: Clear to auscultation bilaterally, no wheezing, no crackles, no rhonchi Abdominal: Soft, non tender, non distended, bowel sounds +, no guarding Extremities: no edema, no cyanosis, pulses palpable bilaterally DP and PT Neuro: Grossly nonfocal  Discharge Instructions  Discharge Orders   Future Orders Complete By Expires     Diet - low sodium heart healthy  As directed     Increase activity slowly  As directed         Medication List    STOP taking these medications       lisinopril 40 MG tablet  Commonly known as:  PRINIVIL,ZESTRIL     oxybutynin 5 MG tablet  Commonly known as:  DITROPAN      TAKE these medications       albuterol 108 (90 BASE) MCG/ACT inhaler  Commonly known as:  PROVENTIL HFA;VENTOLIN HFA  Inhale 2 puffs into the lungs every 6 (six) hours as needed for wheezing or shortness of breath.  ALPRAZolam 0.5 MG tablet  Commonly known as:  XANAX  Take 1 tablet (0.5 mg total) by mouth at bedtime as needed for sleep.     atorvastatin 40 MG tablet  Commonly known as:  LIPITOR  Take 40 mg by mouth every evening.     cholecalciferol 1000 UNITS tablet  Commonly known as:  VITAMIN D  Take 1,000 Units by mouth every evening.     gabapentin 100 MG capsule  Commonly known as:  NEURONTIN  Take 100 mg by mouth 2 (two) times daily.     hydrALAZINE 10 MG tablet  Commonly known as:  APRESOLINE  Take 1 tablet (10 mg total) by mouth 2 (two) times daily.     OCUVITE  PRESERVISION PO  Take 2 tablets by mouth every evening.     ranitidine 300 MG tablet  Commonly known as:  ZANTAC  Take 300 mg by mouth at bedtime.     tamsulosin 0.4 MG Caps  Commonly known as:  FLOMAX  Take 1 capsule (0.4 mg total) by mouth daily after supper.     tiotropium 18 MCG inhalation capsule  Commonly known as:  SPIRIVA  Place 18 mcg into inhaler and inhale every evening.     traZODone 50 MG tablet  Commonly known as:  DESYREL  Take 50 mg by mouth at bedtime.           Follow-up Information   Follow up with BADGER,MICHAEL C, MD In 1 week.   Contact information:   [address] [place] [address] [phone]       Please follow up. (follow up with your urologist next week )        The results of significant diagnostics from this hospitalization (including imaging, microbiology, ancillary and laboratory) are listed below for reference.     Microbiology: No results found for this or any previous visit (from the past 240 hour(s)).   Labs: Basic Metabolic Panel:  Recent Labs Lab 02/12/13 2035 02/13/13 0350 02/14/13 0401  NA 136 136 140  K 5.4* 4.5 4.9  CL 104 107 112  CO2 22 19 22   GLUCOSE 93 100* 82  BUN 47* 43* 31*  CREATININE 2.66* 2.45* 2.23*  CALCIUM 9.0 8.4 8.2*   Liver Function Tests:  Recent Labs Lab 02/13/13 0350  AST 20  ALT 17  ALKPHOS 51  BILITOT 0.2*  PROT 5.9*  ALBUMIN 3.1*   CBC:  Recent Labs Lab 02/12/13 2035 02/13/13 0350 02/14/13 0401  WBC 11.6* 9.0 7.8  NEUTROABS 8.9*  --   --   HGB 10.5* 9.5* 9.4*  HCT 32.0* 29.6* 29.5*  MCV 88.4 88.6 89.4  PLT 381 325 324    SIGNED: Time coordinating discharge: Over 30 minutes  Debbora Presto, MD  [facility] 02/14/2013, 8:23 AM Pager [phone]  If 7PM-7AM, please contact night-coverage [email or web address] Password [email or web address]

## 2013-02-14 NOTE — Progress Notes (Signed)
Patient discharged to home with daughter, discharge instructions reviewed with patient and daughter who verbalized understanding. New RX's given. Twenty minutes was spent instructing patient and daughter on how to care for foley catheter and how to change bags.

## 2013-03-02 ENCOUNTER — Emergency Department (HOSPITAL_COMMUNITY)
Admission: EM | Admit: 2013-03-02 | Discharge: 2013-03-02 | Disposition: A | Discharge status: Home / Self Care | Payer: Medicare Other | Attending: Emergency Medicine | Admitting: Emergency Medicine

## 2013-03-02 ENCOUNTER — Emergency Department (HOSPITAL_COMMUNITY): Payer: Medicare Other

## 2013-03-02 ENCOUNTER — Encounter (HOSPITAL_COMMUNITY): Payer: Self-pay

## 2013-03-02 DIAGNOSIS — D72829 Elevated white blood cell count, unspecified: Secondary | ICD-10-CM | POA: Insufficient documentation

## 2013-03-02 DIAGNOSIS — Z87448 Personal history of other diseases of urinary system: Secondary | ICD-10-CM | POA: Insufficient documentation

## 2013-03-02 DIAGNOSIS — Z87891 Personal history of nicotine dependence: Secondary | ICD-10-CM | POA: Insufficient documentation

## 2013-03-02 DIAGNOSIS — Z79899 Other long term (current) drug therapy: Secondary | ICD-10-CM | POA: Insufficient documentation

## 2013-03-02 DIAGNOSIS — Z466 Encounter for fitting and adjustment of urinary device: Secondary | ICD-10-CM | POA: Insufficient documentation

## 2013-03-02 DIAGNOSIS — R319 Hematuria, unspecified: Secondary | ICD-10-CM | POA: Insufficient documentation

## 2013-03-02 DIAGNOSIS — Y9289 Other specified places as the place of occurrence of the external cause: Secondary | ICD-10-CM | POA: Insufficient documentation

## 2013-03-02 DIAGNOSIS — M549 Dorsalgia, unspecified: Secondary | ICD-10-CM | POA: Insufficient documentation

## 2013-03-02 DIAGNOSIS — J449 Chronic obstructive pulmonary disease, unspecified: Secondary | ICD-10-CM | POA: Insufficient documentation

## 2013-03-02 DIAGNOSIS — Y9389 Activity, other specified: Secondary | ICD-10-CM | POA: Insufficient documentation

## 2013-03-02 DIAGNOSIS — N400 Enlarged prostate without lower urinary tract symptoms: Secondary | ICD-10-CM | POA: Insufficient documentation

## 2013-03-02 DIAGNOSIS — T148XXA Other injury of unspecified body region, initial encounter: Secondary | ICD-10-CM

## 2013-03-02 DIAGNOSIS — R296 Repeated falls: Secondary | ICD-10-CM | POA: Insufficient documentation

## 2013-03-02 DIAGNOSIS — S20219A Contusion of unspecified front wall of thorax, initial encounter: Secondary | ICD-10-CM | POA: Insufficient documentation

## 2013-03-02 DIAGNOSIS — I10 Essential (primary) hypertension: Secondary | ICD-10-CM | POA: Insufficient documentation

## 2013-03-02 DIAGNOSIS — G589 Mononeuropathy, unspecified: Secondary | ICD-10-CM | POA: Insufficient documentation

## 2013-03-02 DIAGNOSIS — R531 Weakness: Secondary | ICD-10-CM

## 2013-03-02 DIAGNOSIS — K219 Gastro-esophageal reflux disease without esophagitis: Secondary | ICD-10-CM | POA: Insufficient documentation

## 2013-03-02 DIAGNOSIS — R5381 Other malaise: Secondary | ICD-10-CM | POA: Insufficient documentation

## 2013-03-02 DIAGNOSIS — J4489 Other specified chronic obstructive pulmonary disease: Secondary | ICD-10-CM | POA: Insufficient documentation

## 2013-03-02 LAB — CBC WITH DIFFERENTIAL/PLATELET
Basophils Absolute: 0 10*3/uL (ref 0.0–0.1)
HCT: 31.2 % — ABNORMAL LOW (ref 39.0–52.0)
Hemoglobin: 9.8 g/dL — ABNORMAL LOW (ref 13.0–17.0)
Lymphocytes Relative: 6 % — ABNORMAL LOW (ref 12–46)
Lymphs Abs: 1.1 10*3/uL (ref 0.7–4.0)
Monocytes Absolute: 1.6 10*3/uL — ABNORMAL HIGH (ref 0.1–1.0)
Monocytes Relative: 9 % (ref 3–12)
Neutro Abs: 15.1 10*3/uL — ABNORMAL HIGH (ref 1.7–7.7)
RBC: 3.52 MIL/uL — ABNORMAL LOW (ref 4.22–5.81)
RDW: 14.5 % (ref 11.5–15.5)
WBC: 17.9 10*3/uL — ABNORMAL HIGH (ref 4.0–10.5)

## 2013-03-02 LAB — COMPREHENSIVE METABOLIC PANEL
AST: 18 U/L (ref 0–37)
CO2: 20 mEq/L (ref 19–32)
Chloride: 105 mEq/L (ref 96–112)
Creatinine, Ser: 2.02 mg/dL — ABNORMAL HIGH (ref 0.50–1.35)
GFR calc non Af Amer: 29 mL/min — ABNORMAL LOW (ref >90)
Glucose, Bld: 97 mg/dL (ref 70–99)
Total Bilirubin: 0.2 mg/dL — ABNORMAL LOW (ref 0.3–1.2)

## 2013-03-02 LAB — URINALYSIS, ROUTINE W REFLEX MICROSCOPIC
Bilirubin Urine: NEGATIVE
Protein, ur: >300 mg/dL — AB
Urobilinogen, UA: 0.2 mg/dL (ref 0.0–1.0)

## 2013-03-02 LAB — URINE MICROSCOPIC-ADD ON

## 2013-03-02 NOTE — ED Notes (Signed)
Patient transported to X-ray 

## 2013-03-02 NOTE — ED Notes (Signed)
Pt sent in from PCP d/t abnormal labs, wbc 17.4; pt is currently being tx for UTI and has a foley d/t urinary retention; pt denies pain at this time, no distress noted

## 2013-03-02 NOTE — ED Provider Notes (Signed)
History     CSN: [id number]  Arrival date & time 03/02/13  0904   First MD Initiated Contact with Patient 03/02/13 [phone]      Chief Complaint  Patient presents with  . Urinary Retention    abnormal labs    (Consider location/radiation/quality/duration/timing/severity/associated sxs/prior treatment) HPI Comments: Patient presents to the ER for evaluation of abnormal labs drawn by her primary care provider yesterday. Patient reports that he was recently hospitalized for urinary retention and kidney problems. He went home about 2 weeks ago. Since then he has progressively declined. Patient has been very weak, had a fall 2 days ago. He is having pain in the left posterior ribs since the fall. He reports the pain as spasms in the left flank when he moves.  Patient went home from the hospital with a Foley catheter. He had it changed at the [place] one week ago, had some bleeding after the change. Current catheter has been in place since then.  Patient seen by his primary doctor yesterday and had blood work performed. He was called today and told that his kidney function looked worse, he was anemic and he had an elevated white blood cell count. He was told to come to the ER for further evaluation. The patient denies abdominal pain. He has not had fever, nausea, vomiting or diarrhea. There is no shortness of breath.   Past Medical History  Diagnosis Date  . COPD (chronic obstructive pulmonary disease)   . Renal disorder   . Hypertension   . BPH (benign prostatic hyperplasia)   . GERD (gastroesophageal reflux disease)   . Neuropathy     Past Surgical History  Procedure Laterality Date  . Tonsillectomy    . Appendectomy    . Hemorrhoid surgery    . Back surgery      Family History  Problem Relation Age of Onset  . Alcohol abuse Father   . Heart failure Father     History  Substance Use Topics  . Smoking status: Former Smoker -- 50 years    Quit date: 04/27/2000  . Smokeless  tobacco: Never Used  . Alcohol Use: No      Review of Systems  Constitutional: Negative for fever.  Respiratory: Negative for shortness of breath.   Cardiovascular: Negative for chest pain.  Gastrointestinal: Negative for abdominal pain.  Genitourinary: Positive for hematuria.  Musculoskeletal: Positive for back pain.    Allergies  Review of patient's allergies indicates no known allergies.  Home Medications   Current Outpatient Rx  Name  Route  Sig  Dispense  Refill  . albuterol (PROVENTIL HFA;VENTOLIN HFA) 108 (90 BASE) MCG/ACT inhaler   Inhalation   Inhale 2 puffs into the lungs every 6 (six) hours as needed for wheezing or shortness of breath.         . ALPRAZolam (XANAX) 0.5 MG tablet   Oral   Take 1 tablet (0.5 mg total) by mouth at bedtime as needed for sleep.   30 tablet   0   . atorvastatin (LIPITOR) 40 MG tablet   Oral   Take 40 mg by mouth every evening.         . cholecalciferol (VITAMIN D) 1000 UNITS tablet   Oral   Take 1,000 Units by mouth every evening.          . gabapentin (NEURONTIN) 100 MG capsule   Oral   Take 100 mg by mouth 2 (two) times daily.          Marland Kitchen  hydrALAZINE (APRESOLINE) 10 MG tablet   Oral   Take 1 tablet (10 mg total) by mouth 2 (two) times daily.   60 tablet   1   . Multiple Vitamins-Minerals (OCUVITE PRESERVISION PO)   Oral   Take 2 tablets by mouth every evening.          . ranitidine (ZANTAC) 300 MG tablet   Oral   Take 300 mg by mouth at bedtime.         . tamsulosin (FLOMAX) 0.4 MG CAPS   Oral   Take 1 capsule (0.4 mg total) by mouth daily after supper.   30 capsule   3   . tiotropium (SPIRIVA) 18 MCG inhalation capsule   Inhalation   Place 18 mcg into inhaler and inhale every evening.          . traZODone (DESYREL) 50 MG tablet   Oral   Take 50 mg by mouth at bedtime.           BP 120/49  Pulse 80  Temp(Src) 98.4 F (36.9 C) (Oral)  Resp 20  SpO2 100%  Physical Exam    Constitutional: He is oriented to person, place, and time. He appears well-developed and well-nourished. No distress.  HENT:  Head: Normocephalic and atraumatic.  Right Ear: Hearing normal.  Left Ear: Hearing normal.  Nose: Nose normal.  Mouth/Throat: Oropharynx is clear and moist and mucous membranes are normal.  Eyes: Conjunctivae and EOM are normal. Pupils are equal, round, and reactive to light.  Neck: Normal range of motion. Neck supple.  Cardiovascular: Regular rhythm, S1 normal and S2 normal.  Exam reveals no gallop and no friction rub.   No murmur heard. Pulmonary/Chest: Effort normal and breath sounds normal. No respiratory distress. He exhibits no tenderness.  Abdominal: Soft. Normal appearance and bowel sounds are normal. There is no hepatosplenomegaly. There is no tenderness. There is no rebound, no guarding, no tenderness at McBurney's point and negative Murphy's sign. No hernia.  Musculoskeletal: Normal range of motion.       Cervical back: Normal.       Thoracic back: Normal.       Lumbar back: He exhibits no bony tenderness.       Back:  Neurological: He is alert and oriented to person, place, and time. He has normal strength. No cranial nerve deficit or sensory deficit. Coordination normal. GCS eye subscore is 4. GCS verbal subscore is 5. GCS motor subscore is 6.  Skin: Skin is warm, dry and intact. No rash noted. No cyanosis.  Psychiatric: He has a normal mood and affect. His speech is normal and behavior is normal. Thought content normal.    ED Course  Procedures (including critical care time)  EKG:  Date: 03/02/2013  Rate: 75  Rhythm: normal sinus rhythm  QRS Axis: normal  Intervals: PR prolonged  ST/T Wave abnormalities: normal  Conduction Disutrbances:first-degree A-V block  and right bundle branch block  Narrative Interpretation:   Old EKG Reviewed: unchanged    Labs Reviewed  CBC WITH DIFFERENTIAL - Abnormal; Notable for the following:    WBC 17.9  (*)    RBC 3.52 (*)    Hemoglobin 9.8 (*)    HCT 31.2 (*)    Neutrophils Relative % 84 (*)    Neutro Abs 15.1 (*)    Lymphocytes Relative 6 (*)    Monocytes Absolute 1.6 (*)    All other components within normal limits  COMPREHENSIVE METABOLIC PANEL - Abnormal; Notable  for the following:    Potassium 5.2 (*)    BUN 36 (*)    Creatinine, Ser 2.02 (*)    Albumin 3.0 (*)    Total Bilirubin 0.2 (*)    GFR calc non Af Amer 29 (*)    GFR calc Af Amer 34 (*)    All other components within normal limits  URINALYSIS, ROUTINE W REFLEX MICROSCOPIC - Abnormal; Notable for the following:    APPearance CLOUDY (*)    Hgb urine dipstick LARGE (*)    Protein, ur >300 (*)    Leukocytes, UA MODERATE (*)    All other components within normal limits  URINE MICROSCOPIC-ADD ON - Abnormal; Notable for the following:    Bacteria, UA MANY (*)    All other components within normal limits  CULTURE, BLOOD (ROUTINE X 2)  CULTURE, BLOOD (ROUTINE X 2)  URINE CULTURE  PROTIME-INR   Dg Chest 2 View  03/02/2013   *RADIOLOGY REPORT*  Clinical Data: Left posterior chest pain.  Recent fall.  CHEST - 2 VIEW  Comparison: 01/17/2012.  Findings: Normal sized heart.  Clear lungs.  No fracture or pneumothorax seen.  Thoracic spine degenerative changes.  IMPRESSION: No acute abnormality.   Original Report Authenticated By: Beckie Salts, M.D.     Diagnosis: Leukocytosis, possibly secondary to Foley catheter    MDM  Patient comes to the emergency department for evaluation of abnormal labs performed by his primary care doctor yesterday. Patient was found to have an elevated white blood cell count with anemia and poor renal function, referred to the ER. We the patient's records, he was hospitalized on May 30 for obstructive uropathy. His creatinine was as high as 2.6 during hospitalization. He is trending downward, now 2.02. Patient was anemic during hospital stay, and later was 9.4. Hemoglobin today 9.8.   The  leukocytosis is new, unclear etiology. Patient's abdominal exam is benign and nontender. I did speculate that the elevated white blood cell count might be secondary to urinary infection. I discussed the patient's care with Doctor Isabel Caprice, on call for urology. Patient has canceled multiple appointments with urology, has not had his workup yet. Doctor Isabel Caprice does not recommend removing the Foley catheter at this time, because he feels the patient would become obstructed again. He also does not recommend treating with antibiotics while the patient has the catheter in place, as it will likely cause the patient to be colonized with resistant organisms, but will not clear up bacteria. At this point the patient does not appear to have sepsis secondary to this possible infection/colonization. He did contact Doctor Badger's office and shared all this information with his nurse, as he is not in the office today.  Patient does report a fall with complaints of pain in the left posterior ribs. There is no crepitance on examination. A chest x-ray was obtained and there is no evidence of pneumonia.  Patient will be discharged. He was counseled that he absolutely needs to followup with urology as soon as possible. He would like to follow up at the [place]. Patient was counseled to return to the ER for fever or worsening weakness.        Gilda Crease, MD 03/02/13 1340

## 2013-03-03 LAB — URINE CULTURE: Colony Count: 2000

## 2013-03-08 LAB — CULTURE, BLOOD (ROUTINE X 2): Culture: NO GROWTH

## 2013-11-16 ENCOUNTER — Other Ambulatory Visit (HOSPITAL_COMMUNITY): Payer: Self-pay | Admitting: Physician Assistant

## 2013-11-16 DIAGNOSIS — R918 Other nonspecific abnormal finding of lung field: Secondary | ICD-10-CM

## 2013-11-24 ENCOUNTER — Ambulatory Visit (HOSPITAL_COMMUNITY)
Admission: RE | Admit: 2013-11-24 | Discharge: 2013-11-24 | Disposition: A | Discharge status: Home / Self Care | Payer: Medicare Other | Source: Ambulatory Visit | Attending: Physician Assistant | Admitting: Physician Assistant

## 2013-11-24 ENCOUNTER — Encounter (HOSPITAL_COMMUNITY): Payer: Self-pay

## 2013-11-24 DIAGNOSIS — N269 Renal sclerosis, unspecified: Secondary | ICD-10-CM | POA: Insufficient documentation

## 2013-11-24 DIAGNOSIS — I700 Atherosclerosis of aorta: Secondary | ICD-10-CM | POA: Insufficient documentation

## 2013-11-24 DIAGNOSIS — I723 Aneurysm of iliac artery: Secondary | ICD-10-CM | POA: Insufficient documentation

## 2013-11-24 DIAGNOSIS — I2510 Atherosclerotic heart disease of native coronary artery without angina pectoris: Secondary | ICD-10-CM | POA: Insufficient documentation

## 2013-11-24 DIAGNOSIS — M169 Osteoarthritis of hip, unspecified: Secondary | ICD-10-CM | POA: Insufficient documentation

## 2013-11-24 DIAGNOSIS — R918 Other nonspecific abnormal finding of lung field: Secondary | ICD-10-CM | POA: Insufficient documentation

## 2013-11-24 DIAGNOSIS — C3490 Malignant neoplasm of unspecified part of unspecified bronchus or lung: Secondary | ICD-10-CM | POA: Insufficient documentation

## 2013-11-24 DIAGNOSIS — J438 Other emphysema: Secondary | ICD-10-CM | POA: Insufficient documentation

## 2013-11-24 DIAGNOSIS — N289 Disorder of kidney and ureter, unspecified: Secondary | ICD-10-CM | POA: Insufficient documentation

## 2013-11-24 DIAGNOSIS — N400 Enlarged prostate without lower urinary tract symptoms: Secondary | ICD-10-CM | POA: Insufficient documentation

## 2013-11-24 DIAGNOSIS — K4090 Unilateral inguinal hernia, without obstruction or gangrene, not specified as recurrent: Secondary | ICD-10-CM | POA: Insufficient documentation

## 2013-11-24 DIAGNOSIS — I6529 Occlusion and stenosis of unspecified carotid artery: Secondary | ICD-10-CM | POA: Insufficient documentation

## 2013-11-24 DIAGNOSIS — Q6101 Congenital single renal cyst: Secondary | ICD-10-CM | POA: Insufficient documentation

## 2013-11-24 DIAGNOSIS — M1610 Unilateral primary osteoarthritis, unspecified hip: Secondary | ICD-10-CM | POA: Insufficient documentation

## 2013-11-24 LAB — GLUCOSE, CAPILLARY: GLUCOSE-CAPILLARY: 95 mg/dL (ref 70–99)

## 2013-11-24 MED ORDER — FLUDEOXYGLUCOSE F - 18 (FDG) INJECTION
9.8000 | Freq: Once | INTRAVENOUS | Status: AC | PRN
  Administered 2013-11-24: 9.8 via INTRAVENOUS

## 2014-12-03 ENCOUNTER — Emergency Department (HOSPITAL_COMMUNITY): Payer: Medicare HMO

## 2014-12-03 ENCOUNTER — Emergency Department (HOSPITAL_COMMUNITY)
Admission: EM | Admit: 2014-12-03 | Discharge: 2014-12-03 | Disposition: A | Discharge status: Home / Self Care | Payer: Medicare HMO | Attending: Emergency Medicine | Admitting: Emergency Medicine

## 2014-12-03 ENCOUNTER — Encounter (HOSPITAL_COMMUNITY): Payer: Self-pay | Admitting: Nurse Practitioner

## 2014-12-03 DIAGNOSIS — M25552 Pain in left hip: Secondary | ICD-10-CM

## 2014-12-03 DIAGNOSIS — J449 Chronic obstructive pulmonary disease, unspecified: Secondary | ICD-10-CM | POA: Insufficient documentation

## 2014-12-03 DIAGNOSIS — Y998 Other external cause status: Secondary | ICD-10-CM | POA: Diagnosis not present

## 2014-12-03 DIAGNOSIS — I10 Essential (primary) hypertension: Secondary | ICD-10-CM | POA: Diagnosis not present

## 2014-12-03 DIAGNOSIS — Z87438 Personal history of other diseases of male genital organs: Secondary | ICD-10-CM | POA: Diagnosis not present

## 2014-12-03 DIAGNOSIS — Z79899 Other long term (current) drug therapy: Secondary | ICD-10-CM | POA: Diagnosis not present

## 2014-12-03 DIAGNOSIS — Y929 Unspecified place or not applicable: Secondary | ICD-10-CM | POA: Diagnosis not present

## 2014-12-03 DIAGNOSIS — K219 Gastro-esophageal reflux disease without esophagitis: Secondary | ICD-10-CM | POA: Insufficient documentation

## 2014-12-03 DIAGNOSIS — W1839XA Other fall on same level, initial encounter: Secondary | ICD-10-CM | POA: Insufficient documentation

## 2014-12-03 DIAGNOSIS — S79912A Unspecified injury of left hip, initial encounter: Secondary | ICD-10-CM | POA: Insufficient documentation

## 2014-12-03 DIAGNOSIS — W19XXXA Unspecified fall, initial encounter: Secondary | ICD-10-CM

## 2014-12-03 DIAGNOSIS — G629 Polyneuropathy, unspecified: Secondary | ICD-10-CM | POA: Insufficient documentation

## 2014-12-03 DIAGNOSIS — Y9389 Activity, other specified: Secondary | ICD-10-CM | POA: Insufficient documentation

## 2014-12-03 DIAGNOSIS — Z87891 Personal history of nicotine dependence: Secondary | ICD-10-CM | POA: Diagnosis not present

## 2014-12-03 DIAGNOSIS — Z87448 Personal history of other diseases of urinary system: Secondary | ICD-10-CM | POA: Insufficient documentation

## 2014-12-03 DIAGNOSIS — R52 Pain, unspecified: Secondary | ICD-10-CM

## 2014-12-03 MED ORDER — MORPHINE SULFATE 2 MG/ML IJ SOLN
2.0000 mg | Freq: Once | INTRAMUSCULAR | Status: AC
  Administered 2014-12-03: 2 mg via INTRAVENOUS
  Filled 2014-12-03: qty 1

## 2014-12-03 MED ORDER — ACETAMINOPHEN 325 MG PO TABS
650.0000 mg | ORAL_TABLET | Freq: Once | ORAL | Status: AC
  Administered 2014-12-03: 650 mg via ORAL
  Filled 2014-12-03: qty 2

## 2014-12-03 NOTE — ED Notes (Addendum)
Radiology called stating patient refusing to lie flat for his xray due to pain. Dr. Tomi Bamberger made aware

## 2014-12-03 NOTE — ED Notes (Signed)
Patient transported to X-ray 

## 2014-12-03 NOTE — ED Notes (Signed)
Pt states he fell out of his recliner and landed on his right hip. Has hx of hip replacement of left hip. Reports that he did not originally have pain in his left hip but after a little while he noticed pain when he tried to ambulate. Pt denies hitting his head when he fell, no pain anywhere else.

## 2014-12-03 NOTE — Discharge Instructions (Signed)

## 2014-12-03 NOTE — ED Notes (Signed)
Patient back to room from radiology. Tolerated pictures well. Repositioned for comfort.

## 2014-12-03 NOTE — ED Notes (Signed)
Pt was sitting in recliner this am, turned to talk to wife, lost his balance and fell out of recliner onto floor. He initially got up walked to bedroom and took a nap. When he woke from nap he walked to kitchen and began to notice pain in his L hip with ambulation. He has hx L hip replacement. Denies any other injuries, a&ox4.

## 2014-12-03 NOTE — ED Notes (Signed)
MD at bedside. 

## 2014-12-03 NOTE — ED Notes (Signed)
If pt is discharged before family returns, call [facility] [place]) at [phone].

## 2014-12-03 NOTE — ED Provider Notes (Signed)
CSN: [id number]     Arrival date & time 12/03/14  1840 History   First MD Initiated Contact with Patient 12/03/14 1857     Chief Complaint  Patient presents with  . Hip Pain    HPI Patient presents to the emergency room with complaints of left hip pain. The patient was sitting in his recliner this morning. He turned suddenly to talk to his wife when he lost his balance and fell out of the recliner onto the floor. Patient states he landed onto his right hip but actually has been having pain on the left hip.  He was able to get up and walk to the bathroom today. He took a nap. When he woke up he started having more pain in his left hip. Certain positions and movements cause the pain increased. He still is able to walk however with his foot turned a certain way using his walker. He denies any back pain. He denies any loss of consciousness. He denies any other injuries. Past Medical History  Diagnosis Date  . COPD (chronic obstructive pulmonary disease)   . Renal disorder   . Hypertension   . BPH (benign prostatic hyperplasia)   . GERD (gastroesophageal reflux disease)   . Neuropathy    Past Surgical History  Procedure Laterality Date  . Tonsillectomy    . Appendectomy    . Hemorrhoid surgery    . Back surgery     Family History  Problem Relation Age of Onset  . Alcohol abuse Father   . Heart failure Father    History  Substance Use Topics  . Smoking status: Former Smoker -- 59 years    Quit date: 04/27/2000  . Smokeless tobacco: Never Used  . Alcohol Use: No    Review of Systems  All other systems reviewed and are negative.     Allergies  Review of patient's allergies indicates no known allergies.  Home Medications   Prior to Admission medications   Medication Sig Start Date End Date Taking? Authorizing Provider  albuterol (PROVENTIL HFA;VENTOLIN HFA) 108 (90 BASE) MCG/ACT inhaler Inhale 2 puffs into the lungs every 6 (six) hours as needed for wheezing or shortness of  breath.   Yes Historical Provider, MD  amLODipine (NORVASC) 5 MG tablet Take 5 tablets by mouth every evening.  09/12/14  Yes Historical Provider, MD  gabapentin (NEURONTIN) 100 MG capsule Take 200 mg by mouth daily.    Yes Historical Provider, MD  Multiple Vitamins-Minerals (OCUVITE PRESERVISION PO) Take 2 tablets by mouth every evening.    Yes Historical Provider, MD  ranitidine (ZANTAC) 300 MG tablet Take 300 mg by mouth at bedtime.   Yes Historical Provider, MD  tamsulosin (FLOMAX) 0.4 MG CAPS Take 1 capsule (0.4 mg total) by mouth daily after supper. 02/14/13  Yes Theodis Blaze, MD  tiotropium (SPIRIVA) 18 MCG inhalation capsule Place 18 mcg into inhaler and inhale every evening.    Yes Historical Provider, MD  ALPRAZolam Duanne Moron) 0.5 MG tablet Take 1 tablet (0.5 mg total) by mouth at bedtime as needed for sleep. Patient not taking: Reported on 12/03/2014 02/14/13   Theodis Blaze, MD  hydrALAZINE (APRESOLINE) 10 MG tablet Take 1 tablet (10 mg total) by mouth 2 (two) times daily. Patient not taking: Reported on 12/03/2014 02/14/13   Theodis Blaze, MD   BP 181/70 mmHg  Pulse 66  Temp(Src) 97.9 F (36.6 C) (Oral)  Resp 12  SpO2 99% Physical Exam  Constitutional: He appears  well-developed and well-nourished. No distress.  HENT:  Head: Normocephalic and atraumatic.  Right Ear: External ear normal.  Left Ear: External ear normal.  Eyes: Conjunctivae are normal. Right eye exhibits no discharge. Left eye exhibits no discharge. No scleral icterus.  Neck: Neck supple. No tracheal deviation present.  Cardiovascular: Normal rate.   Pulmonary/Chest: Effort normal. No stridor. No respiratory distress.  Musculoskeletal: He exhibits no edema.       Right hip: Normal.       Left hip: He exhibits tenderness. He exhibits normal strength, no bony tenderness, no swelling and no deformity.       Left knee: Normal.       Left ankle: Normal.       Cervical back: Normal.       Thoracic back: Normal.        Lumbar back: Normal.  Neurological: He is alert. Cranial nerve deficit: no gross deficits.  Skin: Skin is warm and dry. No rash noted.  Psychiatric: He has a normal mood and affect.  Nursing note and vitals reviewed.   ED Course  Procedures (including critical care time) Labs Review Labs Reviewed - No data to display  Imaging Review Dg Hip Unilat With Pelvis 2-3 Views Left  12/03/2014   CLINICAL DATA:  Pain after falling  EXAM: LEFT HIP (WITH PELVIS) 2-3 VIEWS  COMPARISON:  None.  FINDINGS: There are intact appearances of the left total hip arthroplasty. There is no fracture. There is no dislocation. No bone lesion or bony destruction is evident.  IMPRESSION: Negative for acute fracture or dislocation.   Electronically Signed   By: Andreas Newport M.D.   On: 12/03/2014 21:49     MDM   Final diagnoses:  Pain  Fall  Hip pain, left     2011  Pt was unable to lay flat for the xrays.  Family is concerned about giving him pain meds.  He has kidney issues.  Pt had a  bad reaction in the past associated with an ICU admission.  Will stat saline lock.  Low dose morphine and we can titrate.    Pt tolerated 2 mg morphine.  Pt was able to lay down flat.  Xray without acute fracture.  No dislocation noted.  Discussed with family, he could have an occult hairline fx of pelvis but pt has been able to ambulate.  At this time there does not appear to be any evidence of an acute emergency medical condition and the patient appears stable for discharge with appropriate outpatient follow up. They do not want and RX for pain medications because of prior issues.   Dorie Rank, MD 12/03/14 2218

## 2014-12-03 NOTE — ED Notes (Signed)
Patient ambulated to stretcher, explained that in order for him to have xrays he must lie flat. Pain meds given and pt placed at 45 degrees at this time on his back, tolerating well. Will continue to lie patient further back gradually as pain meds begin to work in order for him to tolerate [employer]

## 2015-02-17 ENCOUNTER — Other Ambulatory Visit: Payer: Self-pay | Admitting: Nephrology

## 2015-02-17 ENCOUNTER — Ambulatory Visit
Admission: RE | Admit: 2015-02-17 | Discharge: 2015-02-17 | Disposition: A | Discharge status: Home / Self Care | Payer: Medicare HMO | Source: Ambulatory Visit | Attending: Nephrology | Admitting: Nephrology

## 2015-02-17 DIAGNOSIS — N179 Acute kidney failure, unspecified: Secondary | ICD-10-CM

## 2015-03-16 ENCOUNTER — Other Ambulatory Visit (HOSPITAL_COMMUNITY): Payer: Self-pay | Admitting: Nephrology

## 2015-03-16 ENCOUNTER — Encounter (HOSPITAL_COMMUNITY)
Admission: RE | Admit: 2015-03-16 | Discharge: 2015-03-16 | Disposition: A | Discharge status: Home / Self Care | Payer: Medicare HMO | Source: Ambulatory Visit | Attending: Nephrology | Admitting: Nephrology

## 2015-03-16 ENCOUNTER — Encounter (HOSPITAL_COMMUNITY): Payer: Self-pay

## 2015-03-16 DIAGNOSIS — N184 Chronic kidney disease, stage 4 (severe): Secondary | ICD-10-CM | POA: Diagnosis not present

## 2015-03-16 DIAGNOSIS — N183 Chronic kidney disease, stage 3 unspecified: Secondary | ICD-10-CM

## 2015-03-16 DIAGNOSIS — D638 Anemia in other chronic diseases classified elsewhere: Secondary | ICD-10-CM | POA: Insufficient documentation

## 2015-03-16 LAB — FERRITIN: Ferritin: 34 ng/mL (ref 24–336)

## 2015-03-16 LAB — IRON AND TIBC
Iron: 94 ug/dL (ref 45–182)
SATURATION RATIOS: 29 % (ref 17.9–39.5)
TIBC: 329 ug/dL (ref 250–450)
UIBC: 235 ug/dL

## 2015-03-16 LAB — HEMOGLOBIN: HEMOGLOBIN: 9.2 g/dL — AB (ref 13.0–17.0)

## 2015-03-16 MED ORDER — EPOETIN ALFA 20000 UNIT/ML IJ SOLN
20000.0000 [IU] | INTRAMUSCULAR | Status: DC
  Administered 2015-03-16: 20000 [IU] via SUBCUTANEOUS
  Filled 2015-03-16: qty 1

## 2015-03-16 NOTE — Discharge Instructions (Signed)
Chronic Kidney Disease °Chronic kidney disease occurs when the kidneys are damaged over a long period. The kidneys are two organs that lie on either side of the spine between the middle of the back and the front of the abdomen. The kidneys:  °· Remove wastes and extra water from the blood.   °· Produce important hormones. These help keep bones strong, regulate blood pressure, and help create red blood cells.   °· Balance the fluids and chemicals in the blood and tissues. °A small amount of kidney damage may not cause problems, but a large amount of damage may make it difficult or impossible for the kidneys to work the way they should. If steps are not taken to slow down the kidney damage or stop it from getting worse, the kidneys may stop working permanently. Most of the time, chronic kidney disease does not go away. However, it can often be controlled, and those with the disease can usually live normal lives. °CAUSES  °The most common causes of chronic kidney disease are diabetes and high blood pressure (hypertension). Chronic kidney disease may also be caused by:  °· Diseases that cause the kidneys' filters to become inflamed.   °· Diseases that affect the immune system.   °· Genetic diseases.   °· Medicines that damage the kidneys, such as anti-inflammatory medicines.   °· Poisoning or exposure to toxic substances.   °· A reoccurring kidney or urinary infection.   °· A problem with urine flow. This may be caused by:   °¨ Cancer.   °¨ Kidney stones.   °¨ An enlarged prostate in males. °SIGNS AND SYMPTOMS  °Because the kidney damage in chronic kidney disease occurs slowly, symptoms develop slowly and may not be obvious until the kidney damage becomes severe. A person may have a kidney disease for years without showing any symptoms. Symptoms can include:  °· Swelling (edema) of the legs, ankles, or feet.   °· Tiredness (lethargy).   °· Nausea or vomiting.   °· Confusion.   °· Problems with urination, such as:    °¨ Decreased urine production.   °¨ Frequent urination, especially at night.   °¨ Frequent accidents in children who are potty trained.   °· Muscle twitches and cramps.   °· Shortness of breath.  °· Weakness.   °· Persistent itchiness.   °· Loss of appetite. °· Metallic taste in the mouth. °· Trouble sleeping. °· Slowed development in children. °· Short stature in children. °DIAGNOSIS  °Chronic kidney disease may be detected and diagnosed by tests, including blood, urine, imaging, or kidney biopsy tests.  °TREATMENT  °Most chronic kidney diseases cannot be cured. Treatment usually involves relieving symptoms and preventing or slowing the progression of the disease. Treatment may include:  °· A special diet. You may need to avoid alcohol and foods that are salty and high in potassium.   °· Medicines. These may:   °¨ Lower blood pressure.   °¨ Relieve anemia.   °¨ Relieve swelling.   °¨ Protect the bones. °HOME CARE INSTRUCTIONS  °· Follow your prescribed diet.   °· Take medicines only as directed by your health care provider. Do not take any new medicines (prescription, over-the-counter, or nutritional supplements) unless approved by your health care provider. Many medicines can worsen your kidney damage or need to have the dose adjusted.   °· Quit smoking if you smoke. Talk to your health care provider about a smoking cessation program.   °· Keep all follow-up visits as directed by your health care provider. °SEEK IMMEDIATE MEDICAL CARE IF: °· Your symptoms get worse or you develop new symptoms.   °· You develop symptoms of end-stage kidney disease. These   include:   Headaches.   Abnormally dark or light skin.   Numbness in the hands or feet.   Easy bruising.   Frequent hiccups.   Menstruation stops.   You have a fever.   You have decreased urine production.   You havepain or bleeding when urinating. MAKE SURE YOU:  Understand these instructions.  Will watch your condition.  Will  get help right away if you are not doing well or get worse. FOR MORE INFORMATION   American Association of Kidney Patients: BombTimer.gl  National Kidney Foundation: www.kidney.[place]: https://mathis.com/  [facility]: www.lifeoptions.org and www.kidneyschool.org Document Released: 06/11/2008 Document Revised: 01/17/2014 Document Reviewed: 05/01/2012 [facility] Patient Information 2015 [place], [place]. This information is not intended to replace advice given to you by your health care provider. Make sure you discuss any questions you have with your health care provider. Epoetin Alfa injection What is this medicine? EPOETIN ALFA (e POE e tin AL fa) helps your body make more red blood cells. This medicine is used to treat anemia caused by chronic kidney failure, cancer chemotherapy, or HIV-therapy. It may also be used before surgery if you have anemia. This medicine may be used for other purposes; ask your health care provider or pharmacist if you have questions. COMMON BRAND NAME(S): Epogen, Procrit What should I tell my health care provider before I take this medicine? They need to know if you have any of these conditions: -blood clotting disorders -cancer patient not on chemotherapy -cystic fibrosis -heart disease, such as angina or heart failure -hemoglobin level of 12 g/dL or greater -high blood pressure -low levels of folate, iron, or vitamin B12 -seizures -an unusual or allergic reaction to erythropoietin, albumin, benzyl alcohol, hamster proteins, other medicines, foods, dyes, or preservatives -pregnant or trying to get pregnant -breast-feeding How should I use this medicine? This medicine is for injection into a vein or under the skin. It is usually given by a health care professional in a hospital or clinic setting. If you get this medicine at home, you will be taught how to prepare and give this medicine. Use exactly as directed. Take your  medicine at regular intervals. Do not take your medicine more often than directed. It is important that you put your used needles and syringes in a special sharps container. Do not put them in a trash can. If you do not have a sharps container, call your pharmacist or healthcare provider to get one. Talk to your pediatrician regarding the use of this medicine in children. While this drug may be prescribed for selected conditions, precautions do apply. Overdosage: If you think you have taken too much of this medicine contact a poison control center or emergency room at once. NOTE: This medicine is only for you. Do not share this medicine with others. What if I miss a dose? If you miss a dose, take it as soon as you can. If it is almost time for your next dose, take only that dose. Do not take double or extra doses. What may interact with this medicine? Do not take this medicine with any of the following medications: -darbepoetin alfa This list may not describe all possible interactions. Give your health care provider a list of all the medicines, herbs, non-prescription drugs, or dietary supplements you use. Also tell them if you smoke, drink alcohol, or use illegal drugs. Some items may interact with your medicine. What should I watch for while using this medicine? Visit your prescriber  or health care professional for regular checks on your progress and for the needed blood tests and blood pressure measurements. It is especially important for the doctor to make sure your hemoglobin level is in the desired range, to limit the risk of potential side effects and to give you the best benefit. Keep all appointments for any recommended tests. Check your blood pressure as directed. Ask your doctor what your blood pressure should be and when you should contact him or her. As your body makes more red blood cells, you may need to take iron, folic acid, or vitamin B supplements. Ask your doctor or health care  provider which products are right for you. If you have kidney disease continue dietary restrictions, even though this medication can make you feel better. Talk with your doctor or health care professional about the foods you eat and the vitamins that you take. What side effects may I notice from receiving this medicine? Side effects that you should report to your doctor or health care professional as soon as possible: -allergic reactions like skin rash, itching or hives, swelling of the face, lips, or tongue -breathing problems -changes in vision -chest pain -confusion, trouble speaking or understanding -feeling faint or lightheaded, falls -high blood pressure -muscle aches or pains -pain, swelling, warmth in the leg -rapid weight gain -severe headaches -sudden numbness or weakness of the face, arm or leg -trouble walking, dizziness, loss of balance or coordination -seizures (convulsions) -swelling of the ankles, feet, hands -unusually weak or tired Side effects that usually do not require medical attention (report to your doctor or health care professional if they continue or are bothersome): -diarrhea -fever, chills (flu-like symptoms) -headaches -nausea, vomiting -redness, stinging, or swelling at site where injected This list may not describe all possible side effects. Call your doctor for medical advice about side effects. You may report side effects to FDA at 1-800-FDA-1088. Where should I keep my medicine? Keep out of the reach of children. Store in a refrigerator between 2 and 8 degrees C (36 and 46 degrees F). Do not freeze or shake. Throw away any unused portion if using a single-dose vial. Multi-dose vials can be kept in the refrigerator for up to 21 days after the initial dose. Throw away unused medicine. NOTE: This sheet is a summary. It may not cover all possible information. If you have questions about this medicine, talk to your doctor, pharmacist, or health care  provider.  2015, Elsevier/Gold Standard. (2008-08-16 10:25:44)

## 2015-03-29 ENCOUNTER — Other Ambulatory Visit: Payer: Self-pay | Admitting: *Deleted

## 2015-03-29 DIAGNOSIS — Z01810 Encounter for preprocedural cardiovascular examination: Secondary | ICD-10-CM

## 2015-03-29 DIAGNOSIS — N184 Chronic kidney disease, stage 4 (severe): Secondary | ICD-10-CM

## 2015-03-30 ENCOUNTER — Other Ambulatory Visit (HOSPITAL_COMMUNITY): Payer: Self-pay | Admitting: Nephrology

## 2015-03-30 ENCOUNTER — Encounter (HOSPITAL_COMMUNITY)
Admission: RE | Admit: 2015-03-30 | Discharge: 2015-03-30 | Disposition: A | Discharge status: Home / Self Care | Payer: Medicare HMO | Source: Ambulatory Visit | Attending: Nephrology | Admitting: Nephrology

## 2015-03-30 ENCOUNTER — Encounter (HOSPITAL_COMMUNITY): Payer: Self-pay

## 2015-03-30 DIAGNOSIS — D638 Anemia in other chronic diseases classified elsewhere: Secondary | ICD-10-CM | POA: Insufficient documentation

## 2015-03-30 DIAGNOSIS — N184 Chronic kidney disease, stage 4 (severe): Secondary | ICD-10-CM | POA: Insufficient documentation

## 2015-03-30 DIAGNOSIS — N183 Chronic kidney disease, stage 3 unspecified: Secondary | ICD-10-CM

## 2015-03-30 HISTORY — DX: Anemia, unspecified: D64.9

## 2015-03-30 LAB — HEMOGLOBIN: Hemoglobin: 8.8 g/dL — ABNORMAL LOW (ref 13.0–17.0)

## 2015-03-30 MED ORDER — SODIUM CHLORIDE 0.9 % IV SOLN
510.0000 mg | Freq: Once | INTRAVENOUS | Status: AC
  Administered 2015-03-30: 510 mg via INTRAVENOUS
  Filled 2015-03-30: qty 17

## 2015-03-30 MED ORDER — EPOETIN ALFA 40000 UNIT/ML IJ SOLN
30000.0000 [IU] | INTRAMUSCULAR | Status: DC
  Administered 2015-03-30: 30000 [IU] via SUBCUTANEOUS
  Filled 2015-03-30: qty 1

## 2015-03-30 MED ORDER — SODIUM CHLORIDE 0.9 % IV SOLN
Freq: Once | INTRAVENOUS | Status: AC
  Administered 2015-03-30: 15:00:00 via INTRAVENOUS

## 2015-03-30 NOTE — Progress Notes (Signed)
Patient in short stay for blood draw and Procrit injection. Additional order for Fereheme IV. Patient tolerated well. Observed 30 minutes post infusion. Home with wife.

## 2015-03-30 NOTE — Discharge Instructions (Signed)
IF YOU ARE GOING TO BE 15 OR MINUTES LATE FOR YOUR APPOINTMENT, PLEASE CALL [phone] TO MAKE OTHER ARRANGEMENTS FOR YOUR TREATMENT  IF YOU ARRIVE EARLY FOR YOUR SCHEDULED APPOINTMENT , YOU MAY HAVE TO WAIT UNTIL YOUR SCHEDULED TIME.Ferumoxytol injection What is this medicine? FERUMOXYTOL is an iron complex. Iron is used to make healthy red blood cells, which carry oxygen and nutrients throughout the body. This medicine is used to treat iron deficiency anemia in people with chronic kidney disease. This medicine may be used for other purposes; ask your health care provider or pharmacist if you have questions. COMMON BRAND NAME(S): Feraheme What should I tell my health care provider before I take this medicine? They need to know if you have any of these conditions: -anemia not caused by low iron levels -high levels of iron in the blood -magnetic resonance imaging (MRI) test scheduled -an unusual or allergic reaction to iron, other medicines, foods, dyes, or preservatives -pregnant or trying to get pregnant -breast-feeding How should I use this medicine? This medicine is for injection into a vein. It is given by a health care professional in a hospital or clinic setting. Talk to your pediatrician regarding the use of this medicine in children. Special care may be needed. Overdosage: If you think you've taken too much of this medicine contact a poison control center or emergency room at once. Overdosage: If you think you have taken too much of this medicine contact a poison control center or emergency room at once. NOTE: This medicine is only for you. Do not share this medicine with others. What if I miss a dose? It is important not to miss your dose. Call your doctor or health care professional if you are unable to keep an appointment. What may interact with this medicine? This medicine may interact with the following medications: -other iron products This list may not describe all possible  interactions. Give your health care provider a list of all the medicines, herbs, non-prescription drugs, or dietary supplements you use. Also tell them if you smoke, drink alcohol, or use illegal drugs. Some items may interact with your medicine. What should I watch for while using this medicine? Visit your doctor or healthcare professional regularly. Tell your doctor or healthcare professional if your symptoms do not start to get better or if they get worse. You may need blood work done while you are taking this medicine. You may need to follow a special diet. Talk to your doctor. Foods that contain iron include: whole grains/cereals, dried fruits, beans, or peas, leafy green vegetables, and organ meats (liver, kidney). What side effects may I notice from receiving this medicine? Side effects that you should report to your doctor or health care professional as soon as possible: -allergic reactions like skin rash, itching or hives, swelling of the face, lips, or tongue -breathing problems -changes in blood pressure -feeling faint or lightheaded, falls -fever or chills -flushing, sweating, or hot feelings -swelling of the ankles or feet Side effects that usually do not require medical attention (Report these to your doctor or health care professional if they continue or are bothersome.): -diarrhea -headache -nausea, vomiting -stomach pain This list may not describe all possible side effects. Call your doctor for medical advice about side effects. You may report side effects to FDA at 1-800-FDA-1088. Where should I keep my medicine? This drug is given in a hospital or clinic and will not be stored at home. NOTE: This sheet is a summary. It may  not cover all possible information. If you have questions about this medicine, talk to your doctor, pharmacist, or health care provider.  2015, Elsevier/Gold Standard. (2012-04-17 15:23:36)

## 2015-04-11 ENCOUNTER — Other Ambulatory Visit: Payer: Self-pay

## 2015-04-11 DIAGNOSIS — Z01810 Encounter for preprocedural cardiovascular examination: Secondary | ICD-10-CM

## 2015-04-11 DIAGNOSIS — N184 Chronic kidney disease, stage 4 (severe): Secondary | ICD-10-CM

## 2015-04-13 ENCOUNTER — Encounter (HOSPITAL_COMMUNITY): Payer: Self-pay

## 2015-04-13 ENCOUNTER — Encounter (HOSPITAL_COMMUNITY)
Admission: RE | Admit: 2015-04-13 | Discharge: 2015-04-13 | Disposition: A | Discharge status: Home / Self Care | Payer: Medicare HMO | Source: Ambulatory Visit | Attending: Nephrology | Admitting: Nephrology

## 2015-04-13 DIAGNOSIS — N183 Chronic kidney disease, stage 3 unspecified: Secondary | ICD-10-CM

## 2015-04-13 DIAGNOSIS — N184 Chronic kidney disease, stage 4 (severe): Secondary | ICD-10-CM | POA: Diagnosis not present

## 2015-04-13 LAB — IRON AND TIBC
Iron: 23 ug/dL — ABNORMAL LOW (ref 45–182)
SATURATION RATIOS: 8 % — AB (ref 17.9–39.5)
TIBC: 272 ug/dL (ref 250–450)
UIBC: 249 ug/dL

## 2015-04-13 LAB — HEMOGLOBIN: HEMOGLOBIN: 10.3 g/dL — AB (ref 13.0–17.0)

## 2015-04-13 LAB — FERRITIN: FERRITIN: 124 ng/mL (ref 24–336)

## 2015-04-13 MED ORDER — EPOETIN ALFA 40000 UNIT/ML IJ SOLN
40000.0000 [IU] | INTRAMUSCULAR | Status: DC
  Filled 2015-04-13: qty 1

## 2015-04-13 MED ORDER — EPOETIN ALFA 40000 UNIT/ML IJ SOLN: 40000.0000 [IU] | INTRAMUSCULAR | Status: DC

## 2015-04-13 MED ORDER — EPOETIN ALFA 40000 UNIT/ML IJ SOLN
40000.0000 [IU] | INTRAMUSCULAR | Status: DC
  Administered 2015-04-13: 40000 [IU] via SUBCUTANEOUS
  Filled 2015-04-13: qty 1

## 2015-04-27 ENCOUNTER — Encounter: Payer: Self-pay | Admitting: Vascular Surgery

## 2015-04-27 ENCOUNTER — Encounter (HOSPITAL_COMMUNITY)
Admission: RE | Admit: 2015-04-27 | Discharge: 2015-04-27 | Disposition: A | Discharge status: Home / Self Care | Payer: Medicare HMO | Source: Ambulatory Visit | Attending: Nephrology | Admitting: Nephrology

## 2015-04-27 DIAGNOSIS — D638 Anemia in other chronic diseases classified elsewhere: Secondary | ICD-10-CM | POA: Diagnosis present

## 2015-04-27 DIAGNOSIS — N183 Chronic kidney disease, stage 3 unspecified: Secondary | ICD-10-CM

## 2015-04-27 DIAGNOSIS — N184 Chronic kidney disease, stage 4 (severe): Secondary | ICD-10-CM | POA: Insufficient documentation

## 2015-04-27 LAB — HEMOGLOBIN: Hemoglobin: 10.8 g/dL — ABNORMAL LOW (ref 13.0–17.0)

## 2015-04-27 MED ORDER — SODIUM CHLORIDE 0.9 % IV SOLN
Freq: Once | INTRAVENOUS | Status: AC
  Administered 2015-04-27: 250 mL via INTRAVENOUS

## 2015-04-27 MED ORDER — EPOETIN ALFA 20000 UNIT/ML IJ SOLN
40000.0000 [IU] | INTRAMUSCULAR | Status: DC
  Administered 2015-04-27: 40000 [IU] via SUBCUTANEOUS
  Filled 2015-04-27: qty 2

## 2015-04-27 MED ORDER — FERUMOXYTOL INJECTION 510 MG/17 ML
510.0000 mg | INTRAVENOUS | Status: DC
  Administered 2015-04-27: 510 mg via INTRAVENOUS
  Filled 2015-04-27: qty 17

## 2015-04-27 MED ORDER — EPOETIN ALFA 40000 UNIT/ML IJ SOLN
40000.0000 [IU] | INTRAMUSCULAR | Status: DC
  Filled 2015-04-27: qty 1

## 2015-04-27 NOTE — Progress Notes (Signed)
Uneventful infusion of #1/2 Feraheme in this series from orders sent 04/14/15

## 2015-04-28 ENCOUNTER — Encounter: Payer: Self-pay | Admitting: Vascular Surgery

## 2015-04-28 ENCOUNTER — Ambulatory Visit (INDEPENDENT_AMBULATORY_CARE_PROVIDER_SITE_OTHER)
Admission: RE | Admit: 2015-04-28 | Discharge: 2015-04-28 | Disposition: A | Discharge status: Home / Self Care | Payer: Medicare HMO | Source: Ambulatory Visit | Attending: Vascular Surgery | Admitting: Vascular Surgery

## 2015-04-28 ENCOUNTER — Ambulatory Visit (HOSPITAL_COMMUNITY)
Admission: RE | Admit: 2015-04-28 | Discharge: 2015-04-28 | Disposition: A | Discharge status: Home / Self Care | Payer: Medicare HMO | Source: Ambulatory Visit | Attending: Vascular Surgery | Admitting: Vascular Surgery

## 2015-04-28 ENCOUNTER — Ambulatory Visit (INDEPENDENT_AMBULATORY_CARE_PROVIDER_SITE_OTHER): Payer: Medicare HMO | Admitting: Vascular Surgery

## 2015-04-28 VITALS — BP 125/49 | HR 66 | Temp 97.5°F | Resp 16 | Ht 68.0 in | Wt 134.0 lb

## 2015-04-28 DIAGNOSIS — Z01810 Encounter for preprocedural cardiovascular examination: Secondary | ICD-10-CM | POA: Diagnosis not present

## 2015-04-28 DIAGNOSIS — R0989 Other specified symptoms and signs involving the circulatory and respiratory systems: Secondary | ICD-10-CM | POA: Diagnosis not present

## 2015-04-28 DIAGNOSIS — N184 Chronic kidney disease, stage 4 (severe): Secondary | ICD-10-CM

## 2015-04-28 DIAGNOSIS — N185 Chronic kidney disease, stage 5: Secondary | ICD-10-CM

## 2015-04-28 NOTE — Progress Notes (Signed)
HISTORY AND PHYSICAL     CC:  In need of permanent HD access Referring Provider:  Chesley Noon, MD  HPI: This is a 81 y.o. male who has CKD 5 (his wife states he has a GFR of 8) who is in need of permanent HD access.  His wife states that he has declined in health recently.  She states that he wants to lay in bed and is very difficult to get out of bed.   He does have a torn left rotator cuff that cannot be fixed. Pt has never had a PPM placed.  He denies prior access or central access.  He does have HTN and is on a CCB hydralazine, and lasix for this.  He also has COPD which he has inhalers for.  He does have a hx of smoking, but quit 8-10 years ago.    He denies hx of stroke or MI or chest pain.  He did recently have a heme + stool on hemoccult and is being referred to a GI specialist.   He referred for dialysis access placement.  Past Medical History  Diagnosis Date  . COPD (chronic obstructive pulmonary disease)   . Renal disorder   . Hypertension   . BPH (benign prostatic hyperplasia)   . GERD (gastroesophageal reflux disease)   . Neuropathy   . Anemia     Past Surgical History  Procedure Laterality Date  . Tonsillectomy    . Appendectomy    . Hemorrhoid surgery    . Back surgery    . Joint replacement Left 2008    hip  . Neck surgery  1970s    cervical fusion T3  . Lumbar laminectomy  [id number]    x2 at [facility]    No Known Allergies  Current Outpatient Prescriptions  Medication Sig Dispense Refill  . albuterol (PROVENTIL HFA;VENTOLIN HFA) 108 (90 BASE) MCG/ACT inhaler Inhale 2 puffs into the lungs every 6 (six) hours as needed for wheezing or shortness of breath.    Marland Kitchen amLODipine (NORVASC) 10 MG tablet Take 10 mg by mouth daily.    . budesonide-formoterol (SYMBICORT) 80-4.5 MCG/ACT inhaler Inhale 2 puffs into the lungs 2 (two) times daily.    . calcitRIOL (ROCALTROL) 0.25 MCG capsule Take 0.25 mcg by mouth daily.    . calcium acetate (PHOSLO) 667 MG capsule  Take 667 mg by mouth 3 (three) times daily with meals.    . finasteride (PROSCAR) 5 MG tablet Take 5 mg by mouth daily.    Marland Kitchen gabapentin (NEURONTIN) 100 MG capsule Take 200 mg by mouth daily.     Marland Kitchen galantamine (RAZADYNE) 8 MG tablet Take 8 mg by mouth 2 (two) times daily.    . Multiple Vitamins-Minerals (OCUVITE PRESERVISION PO) Take 2 tablets by mouth every evening.     . ranitidine (ZANTAC) 300 MG tablet Take 300 mg by mouth at bedtime.    . sodium bicarbonate 650 MG tablet Take 650 mg by mouth 2 (two) times daily.    . tamsulosin (FLOMAX) 0.4 MG CAPS Take 1 capsule (0.4 mg total) by mouth daily after supper. 30 capsule 3  . ALPRAZolam (XANAX) 0.5 MG tablet Take 1 tablet (0.5 mg total) by mouth at bedtime as needed for sleep. (Patient not taking: Reported on 12/03/2014) 30 tablet 0  . amLODipine (NORVASC) 5 MG tablet Take 10 tablets by mouth every evening.   6  . finasteride (PROSCAR) 5 MG tablet Take 5 mg by mouth.    Marland Kitchen  furosemide (LASIX) 80 MG tablet Take 80 mg by mouth daily.    Marland Kitchen galantamine (RAZADYNE) 8 MG tablet Take 8 mg by mouth.    . hydrALAZINE (APRESOLINE) 10 MG tablet Take 1 tablet (10 mg total) by mouth 2 (two) times daily. (Patient not taking: Reported on 12/03/2014) 60 tablet 1  . tiotropium (SPIRIVA) 18 MCG inhalation capsule Place 18 mcg into inhaler and inhale every evening.      No current facility-administered medications for this visit.    Family History  Problem Relation Age of Onset  . Alcohol abuse Father   . Heart failure Father     Social History   Social History  . Marital Status: Married    Spouse Name: Pamala Hurry  . Number of Children: 2  . Years of Education: N/A   Occupational History  . Retired Metallurgist    Social History Main Topics  . Smoking status: Former Smoker -- 38 years    Quit date: 04/27/2000  . Smokeless tobacco: Never Used  . Alcohol Use: No  . Drug Use: No  . Sexual Activity: Not on file   Other Topics Concern  . Not on  file   Social History Narrative   Married.  Lives with wife.  Ambulates without assistance.     ROS: '[x]'$  Positive   '[ ]'$  Negative   '[ ]'$  All sytems reviewed and are negative  Cardiovascular: '[]'$  chest pain/pressure '[]'$  palpitations '[x]'$  SOB lying flat '[x]'$  DOE '[]'$  pain in legs while walking '[]'$  pain in feet when lying flat '[]'$  hx of DVT '[]'$  hx of phlebitis '[]'$  swelling in legs '[]'$  varicose veins  Pulmonary: '[x]'$  productive cough '[]'$  asthma '[x]'$  COPD '[x]'$  wheezing  Neurologic: '[x]'$  weakness in '[]'$  arms '[x]'$  legs '[]'$  numbness in '[]'$  arms '[]'$  legs '[]'$ difficulty speaking or slurred speech '[]'$  temporary loss of vision in one eye '[]'$  dizziness  Hematologic: '[]'$  bleeding problems '[]'$  problems with blood clotting easily  GI '[]'$  vomiting blood '[x]'$  blood in stool/+hemoccult recently  GU: '[]'$  burning with urination '[]'$  blood in urine  Psychiatric: '[]'$  hx of major depression  Integumentary: '[]'$  rashes '[]'$  ulcers  Constitutional: '[]'$  fever '[]'$  chills   PHYSICAL EXAMINATION:  Filed Vitals:   04/28/15 1239  BP: 125/49  Pulse: 66  Temp: 97.5 F (36.4 C)  Resp: 16   Body mass index is 20.38 kg/(m^2).  General:  Frail gentleman in NAD Gait: slow with walker HENT: WNL, normocephalic Pulmonary: normal non-labored breathing , without Rales, rhonchi,  wheezing Cardiac: RRR, without  Murmurs, rubs or gallops; with  ? Left carotid bruit Abdomen: soft, NT, no masses Skin: without rashes, without ulcers  Vascular Exam/Pulses:  Right Left  Radial 2+ (normal) 2+ (normal)  Ulnar Unable to palpate  Unable to palpate   Brachial 2+ (normal) 2+ (normal)  DP 2+ (normal) 2+ (normal)   Extremities: without ischemic changes, without Gangrene , without cellulitis; without open wounds; lef Musculoskeletal: no muscle wasting or atrophy  Neurologic: A&O X 3; Appropriate Affect ; SENSATION: normal; MOTOR FUNCTION:  moving all extremities equally. Speech is fluent/normal Psychiatric: Judgment intact, Mood &  affect appropriate for pt's clinical situation Lymph : No Cervical, Axillary, or Inguinal lymphadenopathy    Non-Invasive Vascular Imaging:   Upper extremity vein mapping 6/64/40: Right cephalic vein:  3.47QQ-5.95GL Right basilic vein:  8.75IE-3.32RJ  Left cephalic vein:  1.88CZ-6.60YT Left basilic vein:  0.16WF-0.93AT  Upper extremity arterial duplex 04/28/15: Right Ulnar:  0.25cm (B) RIght Radial:  0.25cm (B) Right brachial:  0.53 (T)  Left Ulnar:  0.23cm (B) Left Radial:  0.25cm (B) Left Brachial:  0.51cm (B)  -abnormal Allen's test bilaterally  Pt meds includes: Statin:  No. Beta Blocker:  No. Aspirin:  No. ACEI:  No. ARB:  No. Other Antiplatelet/Anticoagulant:  No.    ASSESSMENT/PLAN:: 81 y.o. male with CKD 5 in need of permanent HD access   -long discussion held with pt and his wife about dialysis.  He states that he does want to live and wants to have access to have dialysis to live -discussed the different types of HD access, which includes fistula, graft, and catheter.   -he does have adequate vein on the vein mapping for a left brachiocephalic AVF.  He does have a torn rotator cuff, but he does seem to be able to lay his arm sufficiently to be able to get HD. -the risks of steal syndrome, infection and bleeding were discussed with the pt as well as the chance he may need additional procedures in the future to maximize the fistula.   -he and his wife also understand that he may need a catheter at some point if he needs dialysis before the fistula is mature. -of note, pt had a possible left carotid bruit-d/w Dr. Bridgett Larsson and we will have him get a carotid duplex when he follows up after his surgery.  Leontine Locket, PA-C [facility] [phone]  Clinic MD:  Pt seen and examined in conjunction with Dr. Bridgett Larsson  Addendum  I have independently interviewed and examined the patient, and I agree with the physician assistant's findings.  We discussed his  access options.  I would start with L BC AVF.  Risk, benefits, and alternatives to access surgery were discussed.  The patient is aware the risks include but are not limited to: bleeding, infection, steal syndrome, nerve damage, ischemic monomelic neuropathy, failure to mature, need for additional procedures, death and stroke.  The patient is going to decide if he would like to proceed.  He will contact us to schedule when ready.   Adele Barthel, MD [facility] [place] Office: [phone] Pager: [phone]  04/28/2015, 4:03 PM

## 2015-05-10 ENCOUNTER — Telehealth: Payer: Self-pay | Admitting: *Deleted

## 2015-05-10 NOTE — Telephone Encounter (Signed)
Spouse Walter [name] called Walter [name] to cancel appointment for tomorrow for injection.  Reports he is a patient of Dr. Jimmy Footman and get's injections on the third floor of [facility].  Call Transferred to central operators.

## 2015-05-11 ENCOUNTER — Encounter (HOSPITAL_COMMUNITY): Admission: RE | Admit: 2015-05-11 | Payer: Medicare HMO | Source: Ambulatory Visit

## 2015-05-22 ENCOUNTER — Emergency Department (HOSPITAL_COMMUNITY)

## 2015-05-22 ENCOUNTER — Emergency Department (HOSPITAL_COMMUNITY)
Admission: EM | Admit: 2015-05-22 | Discharge: 2015-05-22 | Disposition: A | Discharge status: Home / Self Care | Attending: Emergency Medicine | Admitting: Emergency Medicine

## 2015-05-22 ENCOUNTER — Encounter (HOSPITAL_COMMUNITY): Payer: Self-pay | Admitting: Emergency Medicine

## 2015-05-22 DIAGNOSIS — J449 Chronic obstructive pulmonary disease, unspecified: Secondary | ICD-10-CM | POA: Diagnosis not present

## 2015-05-22 DIAGNOSIS — G629 Polyneuropathy, unspecified: Secondary | ICD-10-CM | POA: Diagnosis not present

## 2015-05-22 DIAGNOSIS — Y999 Unspecified external cause status: Secondary | ICD-10-CM | POA: Diagnosis not present

## 2015-05-22 DIAGNOSIS — I10 Essential (primary) hypertension: Secondary | ICD-10-CM | POA: Diagnosis not present

## 2015-05-22 DIAGNOSIS — R451 Restlessness and agitation: Secondary | ICD-10-CM | POA: Insufficient documentation

## 2015-05-22 DIAGNOSIS — Z79899 Other long term (current) drug therapy: Secondary | ICD-10-CM | POA: Diagnosis not present

## 2015-05-22 DIAGNOSIS — Z87891 Personal history of nicotine dependence: Secondary | ICD-10-CM | POA: Insufficient documentation

## 2015-05-22 DIAGNOSIS — K219 Gastro-esophageal reflux disease without esophagitis: Secondary | ICD-10-CM | POA: Insufficient documentation

## 2015-05-22 DIAGNOSIS — Y939 Activity, unspecified: Secondary | ICD-10-CM | POA: Insufficient documentation

## 2015-05-22 DIAGNOSIS — Z7951 Long term (current) use of inhaled steroids: Secondary | ICD-10-CM | POA: Insufficient documentation

## 2015-05-22 DIAGNOSIS — N400 Enlarged prostate without lower urinary tract symptoms: Secondary | ICD-10-CM | POA: Insufficient documentation

## 2015-05-22 DIAGNOSIS — Z043 Encounter for examination and observation following other accident: Secondary | ICD-10-CM | POA: Diagnosis not present

## 2015-05-22 DIAGNOSIS — Z862 Personal history of diseases of the blood and blood-forming organs and certain disorders involving the immune mechanism: Secondary | ICD-10-CM | POA: Diagnosis not present

## 2015-05-22 DIAGNOSIS — W19XXXA Unspecified fall, initial encounter: Secondary | ICD-10-CM | POA: Diagnosis not present

## 2015-05-22 DIAGNOSIS — R000 Tachycardia, unspecified: Secondary | ICD-10-CM | POA: Insufficient documentation

## 2015-05-22 DIAGNOSIS — Y929 Unspecified place or not applicable: Secondary | ICD-10-CM | POA: Diagnosis not present

## 2015-05-22 MED ORDER — SODIUM CHLORIDE 0.9 % IV BOLUS (SEPSIS): 1000.0000 mL | INTRAVENOUS | Status: DC

## 2015-05-22 MED ORDER — SODIUM CHLORIDE 0.9 % IV SOLN: 1000.0000 mL | INTRAVENOUS | Status: DC

## 2015-05-22 MED ORDER — LORAZEPAM 0.5 MG PO TABS
0.5000 mg | ORAL_TABLET | Freq: Once | ORAL | Status: AC
  Administered 2015-05-22: 0.5 mg via ORAL
  Filled 2015-05-22: qty 1

## 2015-05-22 MED ORDER — SODIUM CHLORIDE 0.9 % IV SOLN: INTRAVENOUS | Status: DC

## 2015-05-22 MED ORDER — SODIUM CHLORIDE 0.9 % IV BOLUS (SEPSIS)
1000.0000 mL | Freq: Once | INTRAVENOUS | Status: AC
  Administered 2015-05-22: 1000 mL via INTRAVENOUS

## 2015-05-22 MED ORDER — PIPERACILLIN-TAZOBACTAM 3.375 G IVPB 30 MIN: 3.3750 g | Freq: Once | INTRAVENOUS | Status: DC

## 2015-05-22 MED ORDER — VANCOMYCIN HCL IN DEXTROSE 1-5 GM/200ML-% IV SOLN: 1000.0000 mg | Freq: Once | INTRAVENOUS | Status: DC

## 2015-05-22 NOTE — Discharge Instructions (Signed)
The hospice nurse will visit your home later today. If you have any concerns call hospice nurse or return

## 2015-05-22 NOTE — ED Provider Notes (Signed)
CSN: [id number]     Arrival date & time 05/22/15  1601 History   First MD Initiated Contact with Patient 05/22/15 [phone]     Chief Complaint  Patient presents with  . Fall     (Consider location/radiation/quality/duration/timing/severity/associated sxs/prior Treatment) HPI L5 caveat altered mental status. History from EMS and old records, and from patient's wife who arrived several minutes after patient. Patient reportedly was found on the floor and pull out his Foley catheter. No treatment prior to coming here, 5:15 AM today.. Patient is has no specific complaints but states "I feel bad" Past Medical History  Diagnosis Date  . COPD (chronic obstructive pulmonary disease)   . Renal disorder   . Hypertension   . BPH (benign prostatic hyperplasia)   . GERD (gastroesophageal reflux disease)   . Neuropathy   . Anemia    Past Surgical History  Procedure Laterality Date  . Tonsillectomy    . Appendectomy    . Hemorrhoid surgery    . Back surgery    . Joint replacement Left 2008    hip  . Neck surgery  1970s    cervical fusion T3  . Lumbar laminectomy  [id number]    x2 at [facility]   Family History  Problem Relation Age of Onset  . Alcohol abuse Father   . Heart failure Father    Social History  Substance Use Topics  . Smoking status: Former Smoker -- 56 years    Quit date: 04/27/2000  . Smokeless tobacco: Never Used  . Alcohol Use: No    Review of Systems  Unable to perform ROS: Mental status change      Allergies  Review of patient's allergies indicates no known allergies.  Home Medications   Prior to Admission medications   Medication Sig Start Date End Date Taking? Authorizing Provider  albuterol (PROVENTIL HFA;VENTOLIN HFA) 108 (90 BASE) MCG/ACT inhaler Inhale 2 puffs into the lungs every 6 (six) hours as needed for wheezing or shortness of breath.    Historical Provider, MD  ALPRAZolam Duanne Moron) 0.5 MG tablet Take 1 tablet (0.5 mg total) by mouth at bedtime as  needed for sleep. Patient not taking: Reported on 12/03/2014 02/14/13   Theodis Blaze, MD  amLODipine (NORVASC) 10 MG tablet Take 10 mg by mouth daily.    Historical Provider, MD  amLODipine (NORVASC) 5 MG tablet Take 10 tablets by mouth every evening.  09/12/14   Historical Provider, MD  budesonide-formoterol (SYMBICORT) 80-4.5 MCG/ACT inhaler Inhale 2 puffs into the lungs 2 (two) times daily.    Historical Provider, MD  calcitRIOL (ROCALTROL) 0.25 MCG capsule Take 0.25 mcg by mouth daily.    Historical Provider, MD  calcium acetate (PHOSLO) 667 MG capsule Take 667 mg by mouth 3 (three) times daily with meals.    Historical Provider, MD  calcium acetate (PHOSLO) 667 MG capsule  04/25/15   Historical Provider, MD  finasteride (PROSCAR) 5 MG tablet Take 5 mg by mouth daily.    Historical Provider, MD  finasteride (PROSCAR) 5 MG tablet Take 5 mg by mouth.    Historical Provider, MD  furosemide (LASIX) 80 MG tablet Take 80 mg by mouth daily.    Historical Provider, MD  gabapentin (NEURONTIN) 100 MG capsule Take 200 mg by mouth daily.     Historical Provider, MD  galantamine (RAZADYNE) 8 MG tablet Take 8 mg by mouth 2 (two) times daily.    Historical Provider, MD  galantamine (RAZADYNE) 8 MG tablet Take 8  mg by mouth.    Historical Provider, MD  hydrALAZINE (APRESOLINE) 10 MG tablet Take 1 tablet (10 mg total) by mouth 2 (two) times daily. Patient not taking: Reported on 12/03/2014 02/14/13   Theodis Blaze, MD  Multiple Vitamins-Minerals (OCUVITE PRESERVISION PO) Take 2 tablets by mouth every evening.     Historical Provider, MD  ranitidine (ZANTAC) 300 MG tablet Take 300 mg by mouth at bedtime.    Historical Provider, MD  sodium bicarbonate 650 MG tablet Take 650 mg by mouth 2 (two) times daily.    Historical Provider, MD  tamsulosin (FLOMAX) 0.4 MG CAPS Take 1 capsule (0.4 mg total) by mouth daily after supper. 02/14/13   Theodis Blaze, MD  tiotropium (SPIRIVA) 18 MCG inhalation capsule Place 18 mcg into  inhaler and inhale every evening.     Historical Provider, MD   SpO2 90% Physical Exam  Constitutional: He appears distressed.  Acutely and chronically ill-appearing  HENT:  Head: Normocephalic and atraumatic.  Mucous membranes dry  Eyes: Conjunctivae are normal. Pupils are equal, round, and reactive to light.  Neck: Neck supple. No tracheal deviation present. No thyromegaly present.  Cardiovascular:  Tachycardic  Pulmonary/Chest: Effort normal.  Abdominal: Soft. Bowel sounds are normal. He exhibits no distension. There is no tenderness.  Genitourinary:  Bloody urethral meatus  Musculoskeletal: Normal range of motion. He exhibits no edema or tenderness.  No deformity of spine. Muscular atrophy of all 4 extremities  Neurological: He is alert.  Does not follow commands.  Skin: Skin is warm and dry. No rash noted.  Psychiatric:  Mildly agitated  Nursing note and vitals reviewed.   ED Course  Procedures (including critical care time) Labs Review Labs Reviewed - No data to display  Imaging Review No results found. I have personally reviewed and evaluated these images and lab results as part of my medical decision-making.   EKG Interpretation None     10 AM patient appears more comfortable after treatment with intravenous fluids. X-rays reviewed by me Results for orders placed or performed during the hospital encounter of 04/27/15  Hemoglobin  Result Value Ref Range   Hemoglobin 10.8 (L) 13.0 - 17.0 g/dL   Dg Chest 1 View  05/22/2015   CLINICAL DATA:  81 year old male with history of trauma after falling out of bed yesterday evening.  EXAM: CHEST  1 VIEW  COMPARISON:  Chest x-ray 03/02/2013.  FINDINGS: Advanced emphysematous changes throughout the lungs bilaterally. No acute consolidative airspace disease. No pleural effusions. No pneumothorax. Mild bilateral apical pleuroparenchymal thickening, similar to prior studies, presumably chronic post infectious or inflammatory  scarring. No evidence of pulmonary edema. Heart size is normal. Atherosclerosis in the thoracic aorta. Upper mediastinal contours are within normal limits. Visualized bony thorax appears grossly intact.  IMPRESSION: 1. No evidence of significant acute traumatic injury to the thorax. 2. Emphysema. 3. Atherosclerosis.   Electronically Signed   By: Vinnie Langton M.D.   On: 05/22/2015 08:40   Dg Pelvis 1-2 Views  05/22/2015   CLINICAL DATA:  [place] out of bed last evening pulling out Foley catheter. History of left hip replacement. Altered mental status.  EXAM: PELVIS - 1-2 VIEW  COMPARISON:  None.  FINDINGS: Post left total hip replacement, incompletely evaluated but without evidence of hardware failure or loosening on this solitary AP projection radiograph. No definite displaced fracture. Mild degenerative change the right hip is suspected with joint space loss, subchondral sclerosis and osteophytosis though this is incompletely evaluated. Several  phleboliths overlie the lower pelvis bilaterally, left greater right. No radiopaque foreign body.  IMPRESSION: 1. No acute findings. 2. Post left total hip replacement, incompletely evaluated but without definite evidence of hardware failure or loosening.   Electronically Signed   By: Sandi Mariscal M.D.   On: 05/22/2015 08:47   Ct Cervical Spine Wo Contrast  05/22/2015   CLINICAL DATA:  81 year old male with acute fall and cervical spine injury and pain. Initial encounter.  EXAM: CT CERVICAL SPINE WITHOUT CONTRAST  TECHNIQUE: Multidetector CT imaging of the cervical spine was performed without intravenous contrast. Multiplanar CT image reconstructions were also generated. Upper cervical spine images were repeated due to motion.  COMPARISON:  10/21/2009 CT  FINDINGS: There is no evidence of acute fracture, subluxation or prevertebral soft tissue swelling.  Multilevel degenerative disc disease and spondylosis noted. Fusion changes from C2-C4, and C5-C7 noted. Laminectomy  changes at C3, C4-C5 are again identified. Facet arthropathy within the cervical spine identified.  Bony foraminal narrowing at multiple levels again noted.  No focal bony lesions are identified.  No significant soft tissue abnormalities are identified. Biapical pleural-parenchymal scarring noted.  IMPRESSION: No evidence of acute abnormality.  Degenerative and postsurgical changes again identified with multilevel bony foraminal narrowing.   Electronically Signed   By: Margarette Canada M.D.   On: 05/22/2015 08:30    MDM  Patient is DO NOT RESUSCITATE CODE STATUS .  I had a lengthy conversation with patient's wife. Patient and wife wished comfort measures only. No diagnostic workup to investigate sepsis. He does not wish antibiotics. He will except IV fluids. She agrees with imaging to determine if patient has broken bones. We will not reinsert Foley. She wishes for him to return home. He has hospital bed at home. I spoke with hospice and palliative care of [facility]. He will receive a visit from hospice nurse later today Final diagnoses:  None   Diagnosis #1 fall #2 urethral injury #3 fever     Orlie Dakin, MD 05/22/15 1029

## 2015-05-22 NOTE — ED Notes (Signed)
Discharge information discussed with spouse, no further questions or needs at this time. Patient will be departing via EMS.

## 2015-05-22 NOTE — ED Notes (Signed)
MD at bedside. 

## 2015-05-22 NOTE — ED Notes (Signed)
Bed: FE07 Expected date:  Expected time:  Means of arrival:  Comments: EMS 81 yo mal/end stage renal failure/confused, fall, pulled out foley

## 2015-05-22 NOTE — ED Notes (Signed)
Per EMS pt found on the floor from home. Per EMS pt pulled out foley with balloon still inflated. Per EMS pt extremely agitated and confused. Daughter told EMS that pt is acting at his baseline. Pt is confused and agitated.

## 2015-05-22 NOTE — ED Notes (Signed)
Patient transported to CT 

## 2015-05-22 NOTE — ED Notes (Signed)
Pt cleaned and changed. Repositioned. Bed alarm unplugged with wife in the room, but wife was told if she were to step out, to let us know and we will plug the bed alarm back in.

## 2015-05-29 ENCOUNTER — Encounter (HOSPITAL_COMMUNITY): Payer: Medicare HMO

## 2015-05-29 ENCOUNTER — Telehealth: Payer: Self-pay | Admitting: Vascular Surgery

## 2015-05-29 NOTE — Telephone Encounter (Signed)
During a "will call" audit- our RN Arbie Cookey was informed that Mr Inniss is deceased. Please update records.

## 2015-06-12 ENCOUNTER — Other Ambulatory Visit (HOSPITAL_COMMUNITY): Payer: Medicare HMO

## 2015-06-17 DEATH — deceased

## 2015-06-26 ENCOUNTER — Other Ambulatory Visit (HOSPITAL_COMMUNITY): Payer: Medicare HMO

## 2015-07-10 ENCOUNTER — Other Ambulatory Visit (HOSPITAL_COMMUNITY): Payer: Medicare HMO

## 2015-07-24 ENCOUNTER — Other Ambulatory Visit (HOSPITAL_COMMUNITY): Payer: Medicare HMO

## 2015-08-07 ENCOUNTER — Other Ambulatory Visit (HOSPITAL_COMMUNITY): Payer: Medicare HMO

## 2015-08-21 ENCOUNTER — Other Ambulatory Visit (HOSPITAL_COMMUNITY): Payer: Medicare HMO

## 2015-09-04 ENCOUNTER — Other Ambulatory Visit (HOSPITAL_COMMUNITY): Payer: Medicare HMO

## 2016-01-25 IMAGING — CR DG PELVIS 1-2V
1 series · 1 of 1 positions shown · non-contrast
Comparison: None.

CLINICAL DATA: Fell out of bed last evening pulling out Foley
catheter. History of left hip replacement. Altered mental status.

EXAM:
PELVIS - 1-2 VIEW

[t pelvis ap]
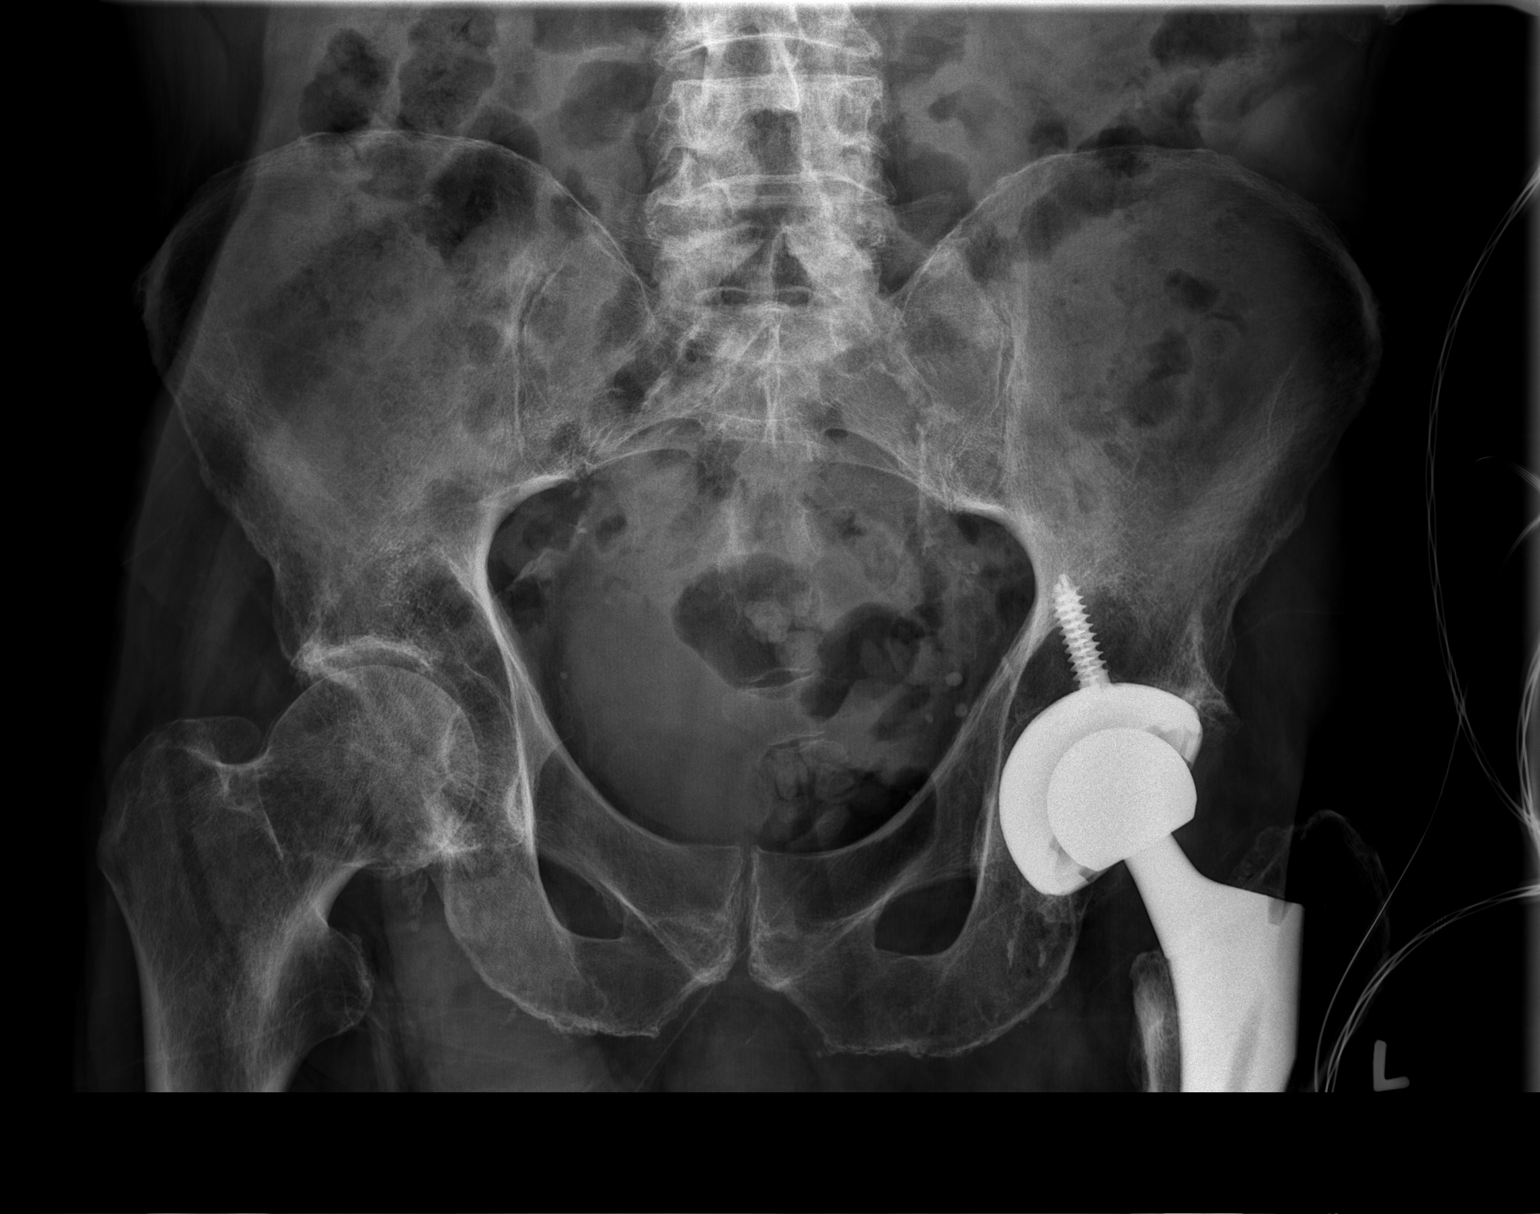

[1 of 1 positions shown; findings below may reference images not displayed]

FINDINGS: Post left total hip replacement, incompletely evaluated but without
evidence of hardware failure or loosening on this solitary AP
projection radiograph. No definite displaced fracture. Mild
degenerative change the right hip is suspected with joint space
loss, subchondral sclerosis and osteophytosis though this is
incompletely evaluated. Several phleboliths overlie the lower pelvis
bilaterally, left greater right. No radiopaque foreign body.
IMPRESSION: 1. No acute findings.
2. Post left total hip replacement, incompletely evaluated but
without definite evidence of hardware failure or loosening.

## 2016-01-25 IMAGING — CT CT CERVICAL SPINE W/O CM
4 of 8 series · 14 of 33 positions shown, 15 images · non-contrast
Comparison: 10/21/2009 CT

CLINICAL DATA: 84-year-old male with acute fall and cervical spine
injury and pain. Initial encounter.

EXAM:
CT CERVICAL SPINE WITHOUT CONTRAST
TECHNIQUE: Multidetector CT imaging of the cervical spine was performed without
intravenous contrast. Multiplanar CT image reconstructions were also
generated. Upper cervical spine images were repeated due to motion.

[Series 2: c-spine st · axial · 0.33mm/px · z∈[-212,-160]mm · 2 of 78 slices shown]
[im 26/78  bone]
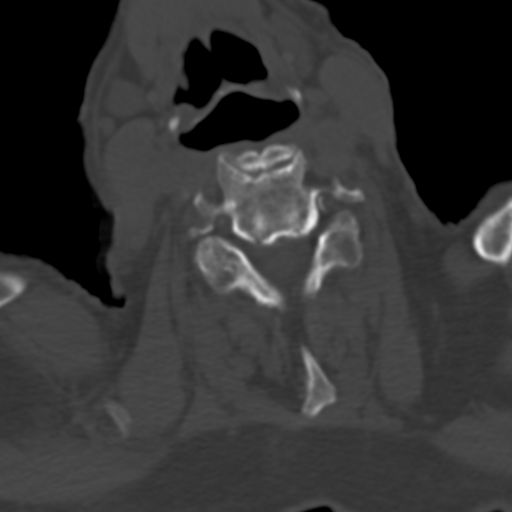
[im 52/78  bone]
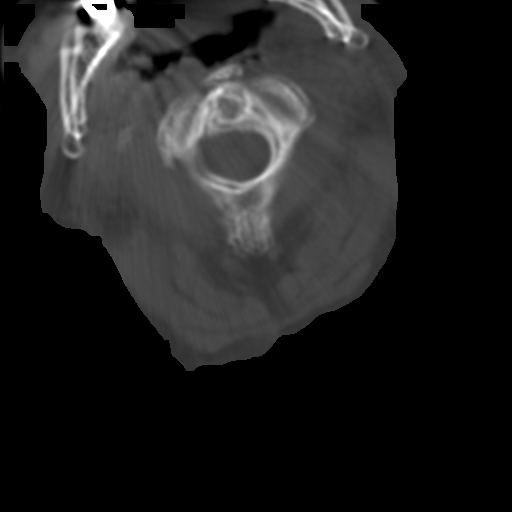

[Series 4: coronal recons · coronal · 0.26mm/px · 3 of 48 slices shown]
[im 14/48  bone]
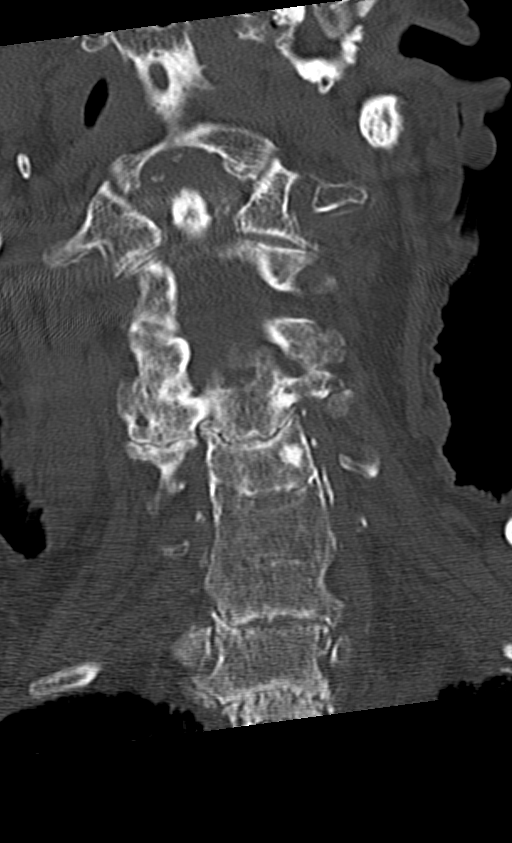
[im 27/48  bone]
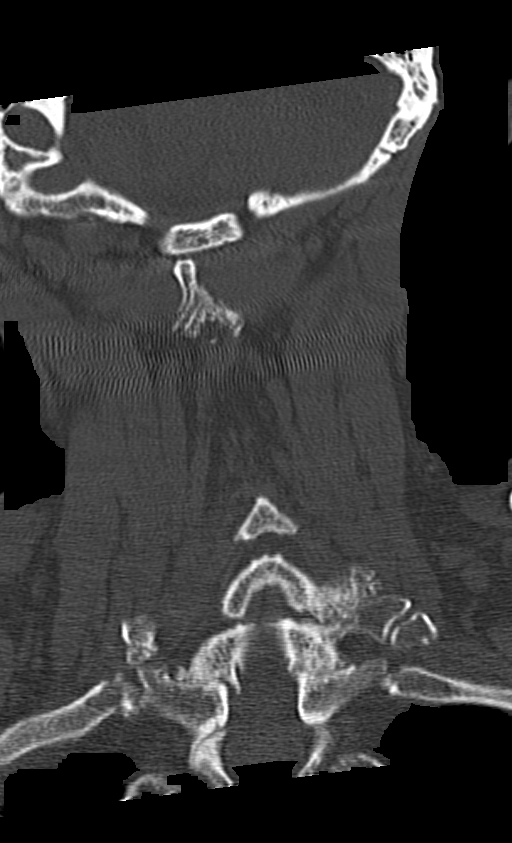
[im 41/48  bone]
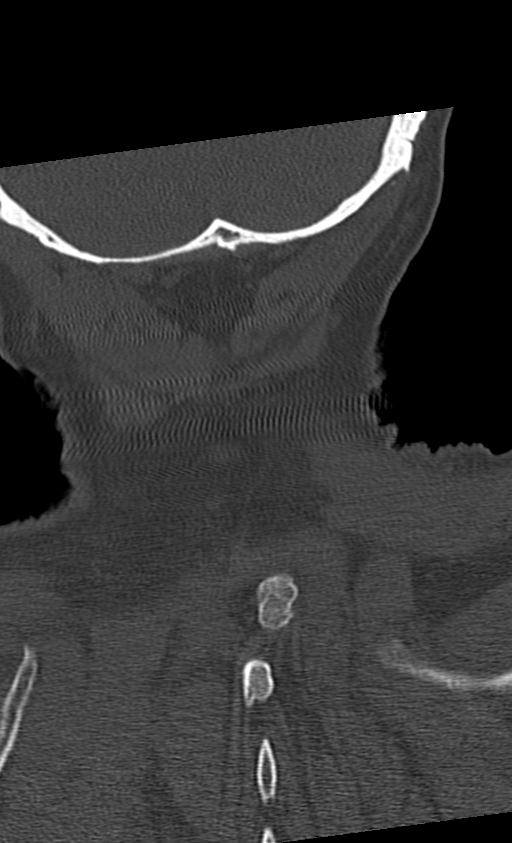

[Series 8: axial reformats · axial · 0.23mm/px · z∈[-276,-171]mm · 4 of 105 slices shown, 5 images]
[im 21/105  soft-tissue]
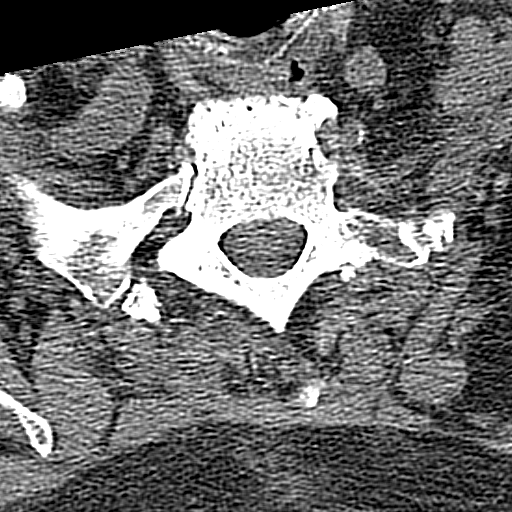
[im 21/105  bone]
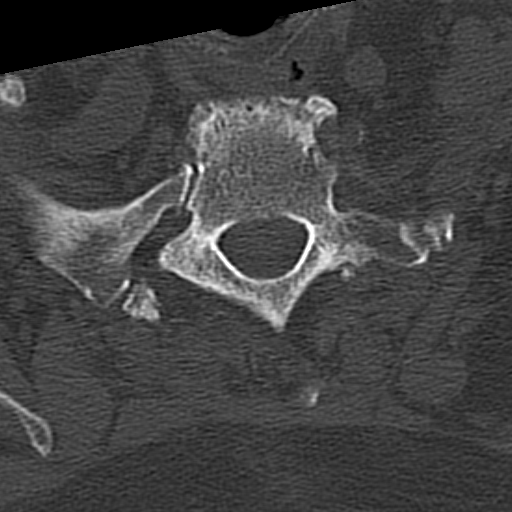
[im 42/105  bone]
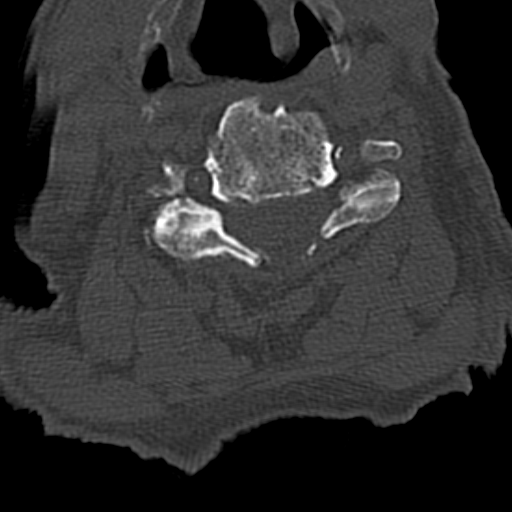
[im 63/105  bone]
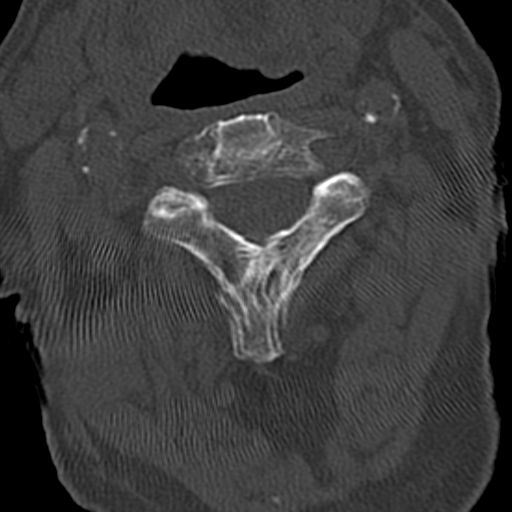
[im 84/105  bone]
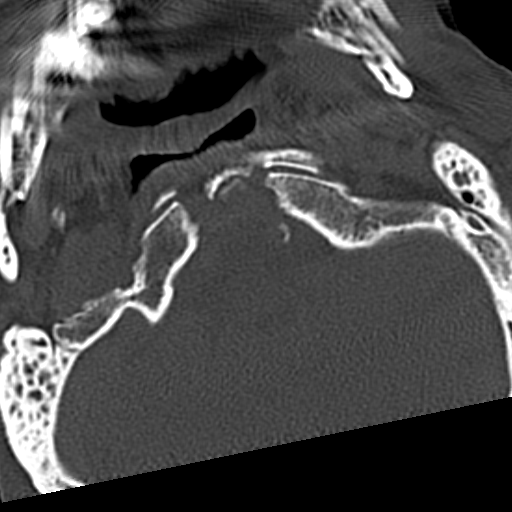

[Series 9: sagittal recons · sagittal · 0.29mm/px · 5 of 47 slices shown]
[im 8/47  bone]
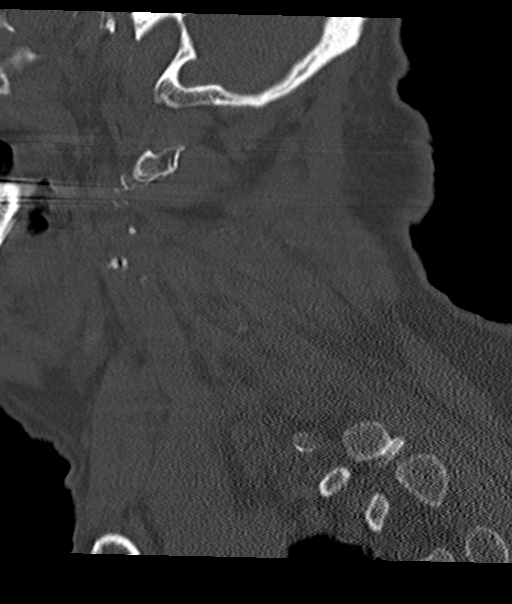
[im 16/47  bone]
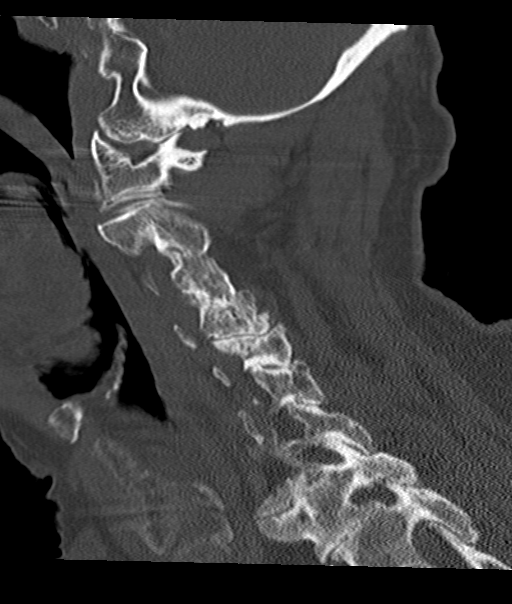
[im 24/47  bone]
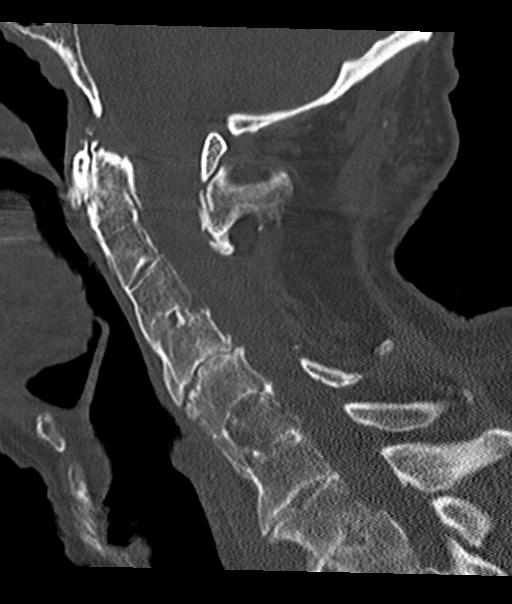
[im 31/47  bone]
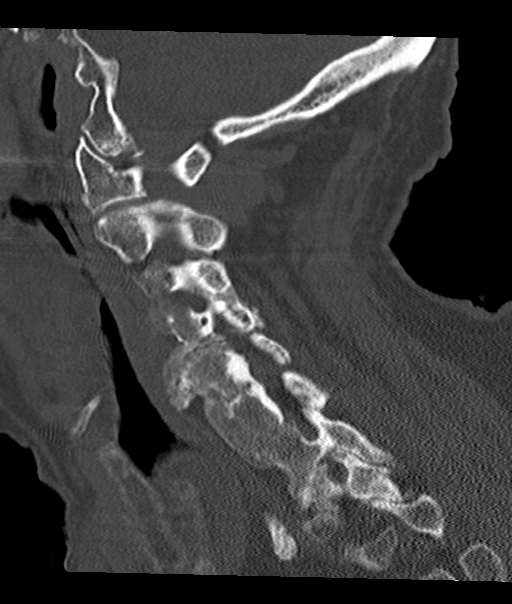
[im 39/47  bone]
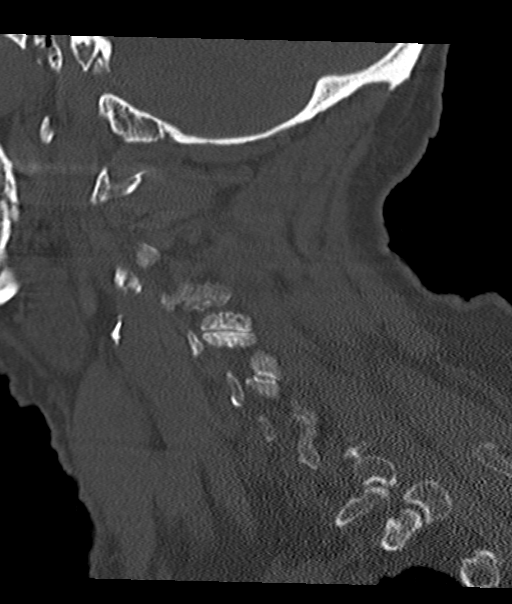

[14 of 33 positions shown; findings below may reference images not displayed]

FINDINGS: There is no evidence of acute fracture, subluxation or prevertebral
soft tissue swelling.

Multilevel degenerative disc disease and spondylosis noted. Fusion
changes from C2-C4, and C5-C7 noted. Laminectomy changes at C3,
C4-C5 are again identified. Facet arthropathy within the cervical
spine identified.

Bony foraminal narrowing at multiple levels again noted.

No focal bony lesions are identified.

No significant soft tissue abnormalities are identified. Biapical
pleural-parenchymal scarring noted.
IMPRESSION: No evidence of acute abnormality.

Degenerative and postsurgical changes again identified with
multilevel bony foraminal narrowing.

## 2021-08-03 ENCOUNTER — Other Ambulatory Visit: Payer: Self-pay | Admitting: Nurse Practitioner

## 2022-01-04 ENCOUNTER — Other Ambulatory Visit: Payer: Self-pay | Admitting: Nurse Practitioner

## 2022-01-05 ENCOUNTER — Other Ambulatory Visit: Payer: Self-pay | Admitting: Nurse Practitioner

## 2022-06-29 ENCOUNTER — Other Ambulatory Visit: Payer: Self-pay | Admitting: Nurse Practitioner

## 2022-12-19 ENCOUNTER — Other Ambulatory Visit: Payer: Self-pay | Admitting: Physician Assistant
